# Patient Record
Sex: Female | Born: 1970 | Race: White | Hispanic: No | Marital: Single | Smoking: Current every day smoker
Health system: Southern US, Community
[De-identification: ages and names within clinical notes are randomized; demographics above are authoritative.]

## PROBLEM LIST (undated history)

## (undated) DIAGNOSIS — M1990 Unspecified osteoarthritis, unspecified site: Secondary | ICD-10-CM

## (undated) DIAGNOSIS — S62307A Unspecified fracture of fifth metacarpal bone, left hand, initial encounter for closed fracture: Secondary | ICD-10-CM

## (undated) DIAGNOSIS — S42362A Displaced segmental fracture of shaft of humerus, left arm, initial encounter for closed fracture: Secondary | ICD-10-CM

## (undated) DIAGNOSIS — Z8619 Personal history of other infectious and parasitic diseases: Secondary | ICD-10-CM

## (undated) DIAGNOSIS — R51 Headache: Secondary | ICD-10-CM

## (undated) DIAGNOSIS — F0390 Unspecified dementia without behavioral disturbance: Secondary | ICD-10-CM

## (undated) DIAGNOSIS — F0280 Dementia in other diseases classified elsewhere without behavioral disturbance: Secondary | ICD-10-CM

## (undated) DIAGNOSIS — G039 Meningitis, unspecified: Secondary | ICD-10-CM

## (undated) DIAGNOSIS — F419 Anxiety disorder, unspecified: Secondary | ICD-10-CM

## (undated) DIAGNOSIS — I959 Hypotension, unspecified: Secondary | ICD-10-CM

## (undated) DIAGNOSIS — R519 Headache, unspecified: Secondary | ICD-10-CM

## (undated) DIAGNOSIS — R112 Nausea with vomiting, unspecified: Secondary | ICD-10-CM

## (undated) DIAGNOSIS — I209 Angina pectoris, unspecified: Secondary | ICD-10-CM

## (undated) DIAGNOSIS — G8929 Other chronic pain: Secondary | ICD-10-CM

## (undated) DIAGNOSIS — G629 Polyneuropathy, unspecified: Secondary | ICD-10-CM

## (undated) DIAGNOSIS — J449 Chronic obstructive pulmonary disease, unspecified: Secondary | ICD-10-CM

## (undated) DIAGNOSIS — S52121A Displaced fracture of head of right radius, initial encounter for closed fracture: Secondary | ICD-10-CM

## (undated) DIAGNOSIS — A6920 Lyme disease, unspecified: Secondary | ICD-10-CM

## (undated) DIAGNOSIS — I82409 Acute embolism and thrombosis of unspecified deep veins of unspecified lower extremity: Secondary | ICD-10-CM

## (undated) DIAGNOSIS — R053 Chronic cough: Secondary | ICD-10-CM

## (undated) DIAGNOSIS — R42 Dizziness and giddiness: Secondary | ICD-10-CM

## (undated) DIAGNOSIS — G40909 Epilepsy, unspecified, not intractable, without status epilepticus: Secondary | ICD-10-CM

## (undated) DIAGNOSIS — M797 Fibromyalgia: Secondary | ICD-10-CM

## (undated) DIAGNOSIS — R05 Cough: Secondary | ICD-10-CM

## (undated) DIAGNOSIS — K219 Gastro-esophageal reflux disease without esophagitis: Secondary | ICD-10-CM

## (undated) DIAGNOSIS — F17200 Nicotine dependence, unspecified, uncomplicated: Secondary | ICD-10-CM

## (undated) DIAGNOSIS — Z9889 Other specified postprocedural states: Secondary | ICD-10-CM

## (undated) DIAGNOSIS — M549 Dorsalgia, unspecified: Secondary | ICD-10-CM

## (undated) HISTORY — PX: PARTIAL HYSTERECTOMY: SHX80

## (undated) HISTORY — PX: KNEE SURGERY: SHX244

## (undated) HISTORY — DX: Meningitis, unspecified: G03.9

## (undated) HISTORY — PX: BREAST SURGERY: SHX581

## (undated) HISTORY — DX: Acute embolism and thrombosis of unspecified deep veins of unspecified lower extremity: I82.409

## (undated) HISTORY — PX: OTHER SURGICAL HISTORY: SHX169

## (undated) HISTORY — DX: Headache, unspecified: R51.9

## (undated) HISTORY — DX: Anxiety disorder, unspecified: F41.9

## (undated) HISTORY — DX: Other chronic pain: G89.29

## (undated) HISTORY — PX: COLONOSCOPY W/ POLYPECTOMY: SHX1380

## (undated) HISTORY — PX: TOTAL KNEE ARTHROPLASTY: SHX125

## (undated) HISTORY — DX: Lyme disease, unspecified: A69.20

## (undated) HISTORY — DX: Unspecified osteoarthritis, unspecified site: M19.90

## (undated) HISTORY — DX: Headache: R51

## (undated) HISTORY — DX: Unspecified dementia, unspecified severity, without behavioral disturbance, psychotic disturbance, mood disturbance, and anxiety: F03.90

## (undated) HISTORY — PX: EYE SURGERY: SHX253

---

## 2002-10-02 HISTORY — PX: TUBAL LIGATION: SHX77

## 2013-02-21 ENCOUNTER — Institutional Professional Consult (permissible substitution): Payer: Self-pay | Admitting: Internal Medicine

## 2013-03-13 ENCOUNTER — Institutional Professional Consult (permissible substitution): Payer: Self-pay | Admitting: Internal Medicine

## 2013-03-14 ENCOUNTER — Ambulatory Visit (INDEPENDENT_AMBULATORY_CARE_PROVIDER_SITE_OTHER): Payer: Medicare Other | Admitting: Internal Medicine

## 2013-03-14 ENCOUNTER — Encounter: Payer: Self-pay | Admitting: Internal Medicine

## 2013-03-14 VITALS — BP 110/80 | HR 83 | Temp 97.4°F | Ht 67.25 in | Wt 206.8 lb

## 2013-03-14 DIAGNOSIS — R05 Cough: Secondary | ICD-10-CM

## 2013-03-14 MED ORDER — PREDNISONE (PAK) 10 MG PO TABS: ORAL_TABLET | ORAL | Status: AC

## 2013-03-14 NOTE — Patient Instructions (Addendum)
The key to effective treatment for your cough is eliminating the non-stop cycle of cough you're stuck in long enough to let your airway heal completely and then see if there is anything still making you cough once you stop the cough suppression, but this should take no more than 5 days to figure out  First take delsym two tsp every 12 hours and supplement if needed with  tramadol 50 mg up to 2 every 4 hours to suppress the urge to cough at all or even clear your throat. Swallowing water or using ice chips/non mint and menthol containing candies (such as lifesavers or sugarless jolly ranchers) are also effective.  You should rest your voice and avoid activities that you know make you cough.  Once you have eliminated the cough for 3 straight days try reducing the tramadol first,  then the delsym as tolerated.    Try nexium  Take 30- 60 min before your first and last meals of the day  Pepcid 40 mg one bedtime until cough is completely gone for at least a week without the need for cough suppression and take 2 Metopromide at bedtime plus chlortrimeton 4 mg at bedtime  I think of reflux for chronic cough like I do oxygen for fire (doesn't cause the fire but once you get the oxygen suppressed it usually goes away regardless of the exact cause).  GERD (REFLUX)  is an extremely common cause of respiratory symptoms, many times with no significant heartburn at all.    It can be treated with medication, but also with lifestyle changes including avoidance of late meals, excessive alcohol, smoking cessation, and avoid fatty foods, chocolate, peppermint, colas, red wine, and acidic juices such as orange juice.  NO MINT OR MENTHOL PRODUCTS SO NO COUGH DROPS  USE SUGARLESS CANDY INSTEAD (jolley ranchers or Stover's)  NO OIL BASED VITAMINS - use powdered substitutes.    Prednisone 10 mg take  4 each am x 2 days,   2 each am x 2 days,  1 each am x 2 days and stop    If not better > See Tammy NP w/in 2 weeks with  all your medications, even over the counter meds, separated in two separate bags, the ones you take no matter what vs the ones you stop once you feel better and take only as needed when you feel you need them.   Tammy  will generate for you a new user friendly medication calendar that will put [place] all on the same page re: your medication use.     Without this process, it simply isn't possible to assure that we are providing  your outpatient care  with  the attention to detail we feel you deserve.   If we cannot assure that you're getting that kind of care,  then we cannot manage your problem effectively from this clinic.  Once you have seen Tammy and we are sure that we're all on the same page with your medication use she will arrange follow up with me.

## 2013-03-14 NOTE — Progress Notes (Signed)
  Subjective:    Patient ID: Lori Dickerson, female    DOB: [birth year]   MRN: [id number]  HPI  48 yowf smoker with h/o recurrent "pna" x 2004 eval and Rx in [place] and requiring freq saba referred by Kendra Opitz at [facility] for refractory cough x 11/2012  03/14/2013 1st pulmonary ov cc nightly cough at hs years and poor sleep and worse in am but dry with chest and bilateral rib pain much worse x 3 months   has sense of throat soreness and too much throat mucus chronically assoc with mild dysyphia.  No obvious daytime variabilty or mucus production or cp or chest tightness, subjective wheeze overt sinus or hb symptoms. No unusual exp hx or h/o childhood pna/ asthma or knowledge of premature birth.    a. Also denies any obvious fluctuation of symptoms with weather or environmental changes or other aggravating or alleviating factors except as outlined above   Review of Systems  Constitutional: Negative for fever, chills and unexpected weight change.  HENT: Positive for sore throat, trouble swallowing and dental problem. Negative for ear pain, nosebleeds, congestion, rhinorrhea, sneezing, voice change, postnasal drip and sinus pressure.   Eyes: Negative for visual disturbance.  Respiratory: Positive for cough and shortness of breath. Negative for choking.   Cardiovascular: Positive for chest pain. Negative for leg swelling.  Gastrointestinal: Negative for vomiting, abdominal pain and diarrhea.  Genitourinary: Negative for difficulty urinating.  Musculoskeletal: Positive for arthralgias.  Skin: Negative for rash.  Neurological: Positive for headaches. Negative for tremors and syncope.  Hematological: Does not bruise/bleed easily.       Objective:   Physical Exam  Wt Readings from Last 3 Encounters:  03/14/13 206 lb 12.8 oz (93.804 kg)    HEENT: nl dentition, turbinates, and orophanx. Nl external ear canals without cough reflex   NECK :  without JVD/Nodes/TM/ nl carotid upstrokes  bilaterally   LUNGS: no acc muscle use, clear to A and P bilaterally without cough on insp or exp maneuvers   CV:  RRR  no s3 or murmur or increase in P2, no edema   ABD:  soft and nontender with nl excursion in the supine position. No bruits or organomegaly, bowel sounds nl  MS:  warm without deformities, calf tenderness, cyanosis or clubbing  SKIN: warm and dry without lesions    NEURO:  alert, approp, no deficits            Assessment & Plan:

## 2013-03-16 NOTE — Assessment & Plan Note (Addendum)
The most common causes of chronic cough in immunocompetent adults include the following: upper airway cough syndrome (UACS), previously referred to as postnasal drip syndrome (PNDS), which is caused by variety of rhinosinus conditions; (2) asthma; (3) GERD; (4) chronic bronchitis from cigarette smoking or other inhaled environmental irritants; (5) nonasthmatic eosinophilic bronchitis; and (6) bronchiectasis.   These conditions, singly or in combination, have accounted for up to 94% of the causes of chronic cough in prospective studies.   Other conditions have constituted no >6% of the causes in prospective studies These have included bronchogenic carcinoma, chronic interstitial pneumonia, sarcoidosis, left ventricular failure, ACEI-induced cough, and aspiration from a condition associated with pharyngeal dysfunction.    Unusual patter of cough in a smoker with no excess mucus suggests  Classic Upper airway cough syndrome, so named because it's frequently impossible to sort out how much is  CR/sinusitis with freq throat clearing (which can be related to primary GERD)   vs  causing  secondary (" extra esophageal")  GERD from wide swings in gastric pressure that occur with throat clearing, often  promoting self use of mint and menthol lozenges that reduce the lower esophageal sphincter tone and exacerbate the problem further in a cyclical fashion.   These are the same pts (now being labeled as having "irritable larynx syndrome" by some cough centers) who not infrequently have a history of having failed to tolerate ace inhibitors,  dry powder inhalers or biphosphonates or report having atypical reflux symptoms that don't respond to standard doses of PPI , and are easily confused as having aecopd or asthma flares by even experienced allergists/ pulmonologists.     For now max rx for gerd/cycylical cough then regroup in 2 weeks with all meds in hand.  NB The standardized cough guidelines published in Chest  by [place] in 2006 are still the best available and consist of a multiple step process (up to 12!) , not a single office visit,  and are intended  to address this problem logically,  with an alogrithm dependent on response to empiric treatment at  each progressive step  to determine a specific diagnosis with  minimal addtional testing needed. Therefore if adherence is an issue or can't be accurately verified,  it's very unlikely the standard evaluation and treatment will be successful here.    Furthermore, response to therapy (other than acute cough suppression, which should only be used short term with avoidance of narcotic containing cough syrups if possible), can be a gradual process for which the patient may perceive immediate benefit.  Unlike going to an eye doctor where the best perscription is almost always the first one and is immediately effective, this is almost never the case in the management of chronic cough syndromes. Therefore the patient needs to commit up front to consistently adhere to recommendations  for up to 6 weeks of therapy directed at the likely underlying problem(s) before the response can be reasonably evaluated.

## 2013-03-28 ENCOUNTER — Encounter: Payer: Medicare Other | Admitting: Adult Health

## 2015-07-18 ENCOUNTER — Emergency Department (HOSPITAL_COMMUNITY): Payer: Medicare Other

## 2015-07-18 ENCOUNTER — Inpatient Hospital Stay (HOSPITAL_COMMUNITY)
Admission: EM | Admit: 2015-07-18 | Discharge: 2015-07-31 | DRG: 958 | Disposition: A | Discharge status: Skilled Nursing Facility | Payer: Medicare Other | Attending: General Surgery | Admitting: General Surgery

## 2015-07-18 ENCOUNTER — Encounter (HOSPITAL_COMMUNITY): Payer: Self-pay | Admitting: Emergency Medicine

## 2015-07-18 DIAGNOSIS — F17210 Nicotine dependence, cigarettes, uncomplicated: Secondary | ICD-10-CM | POA: Diagnosis not present

## 2015-07-18 DIAGNOSIS — G5630 Lesion of radial nerve, unspecified upper limb: Secondary | ICD-10-CM | POA: Diagnosis not present

## 2015-07-18 DIAGNOSIS — S52121A Displaced fracture of head of right radius, initial encounter for closed fracture: Secondary | ICD-10-CM | POA: Diagnosis not present

## 2015-07-18 DIAGNOSIS — M21929 Unspecified acquired deformity of unspecified upper arm: Secondary | ICD-10-CM

## 2015-07-18 DIAGNOSIS — S069X9A Unspecified intracranial injury with loss of consciousness of unspecified duration, initial encounter: Secondary | ICD-10-CM | POA: Diagnosis present

## 2015-07-18 DIAGNOSIS — S62317A Displaced fracture of base of fifth metacarpal bone. left hand, initial encounter for closed fracture: Secondary | ICD-10-CM | POA: Diagnosis present

## 2015-07-18 DIAGNOSIS — Z23 Encounter for immunization: Secondary | ICD-10-CM

## 2015-07-18 DIAGNOSIS — I959 Hypotension, unspecified: Secondary | ICD-10-CM | POA: Diagnosis not present

## 2015-07-18 DIAGNOSIS — R52 Pain, unspecified: Secondary | ICD-10-CM

## 2015-07-18 DIAGNOSIS — G839 Paralytic syndrome, unspecified: Secondary | ICD-10-CM | POA: Diagnosis present

## 2015-07-18 DIAGNOSIS — D62 Acute posthemorrhagic anemia: Secondary | ICD-10-CM | POA: Diagnosis not present

## 2015-07-18 DIAGNOSIS — J939 Pneumothorax, unspecified: Secondary | ICD-10-CM | POA: Diagnosis not present

## 2015-07-18 DIAGNOSIS — M797 Fibromyalgia: Secondary | ICD-10-CM | POA: Diagnosis not present

## 2015-07-18 DIAGNOSIS — Z6834 Body mass index (BMI) 34.0-34.9, adult: Secondary | ICD-10-CM | POA: Diagnosis not present

## 2015-07-18 DIAGNOSIS — S2242XA Multiple fractures of ribs, left side, initial encounter for closed fracture: Secondary | ICD-10-CM | POA: Diagnosis present

## 2015-07-18 DIAGNOSIS — K219 Gastro-esophageal reflux disease without esophagitis: Secondary | ICD-10-CM | POA: Diagnosis not present

## 2015-07-18 DIAGNOSIS — F419 Anxiety disorder, unspecified: Secondary | ICD-10-CM | POA: Diagnosis not present

## 2015-07-18 DIAGNOSIS — E559 Vitamin D deficiency, unspecified: Secondary | ICD-10-CM | POA: Diagnosis not present

## 2015-07-18 DIAGNOSIS — G8929 Other chronic pain: Secondary | ICD-10-CM | POA: Diagnosis not present

## 2015-07-18 DIAGNOSIS — K5900 Constipation, unspecified: Secondary | ICD-10-CM

## 2015-07-18 DIAGNOSIS — F17200 Nicotine dependence, unspecified, uncomplicated: Secondary | ICD-10-CM | POA: Diagnosis present

## 2015-07-18 DIAGNOSIS — E876 Hypokalemia: Secondary | ICD-10-CM | POA: Diagnosis not present

## 2015-07-18 DIAGNOSIS — S5420XA Injury of radial nerve at forearm level, unspecified arm, initial encounter: Secondary | ICD-10-CM | POA: Diagnosis not present

## 2015-07-18 DIAGNOSIS — J449 Chronic obstructive pulmonary disease, unspecified: Secondary | ICD-10-CM | POA: Diagnosis not present

## 2015-07-18 DIAGNOSIS — S42212A Unspecified displaced fracture of surgical neck of left humerus, initial encounter for closed fracture: Secondary | ICD-10-CM | POA: Diagnosis present

## 2015-07-18 DIAGNOSIS — S62307A Unspecified fracture of fifth metacarpal bone, left hand, initial encounter for closed fracture: Secondary | ICD-10-CM | POA: Diagnosis not present

## 2015-07-18 DIAGNOSIS — S42302A Unspecified fracture of shaft of humerus, left arm, initial encounter for closed fracture: Secondary | ICD-10-CM | POA: Diagnosis not present

## 2015-07-18 DIAGNOSIS — H538 Other visual disturbances: Secondary | ICD-10-CM | POA: Diagnosis not present

## 2015-07-18 DIAGNOSIS — Z79891 Long term (current) use of opiate analgesic: Secondary | ICD-10-CM | POA: Diagnosis not present

## 2015-07-18 DIAGNOSIS — K567 Ileus, unspecified: Secondary | ICD-10-CM | POA: Diagnosis not present

## 2015-07-18 DIAGNOSIS — S270XXA Traumatic pneumothorax, initial encounter: Secondary | ICD-10-CM | POA: Diagnosis present

## 2015-07-18 DIAGNOSIS — M542 Cervicalgia: Secondary | ICD-10-CM

## 2015-07-18 DIAGNOSIS — G40909 Epilepsy, unspecified, not intractable, without status epilepticus: Secondary | ICD-10-CM | POA: Diagnosis not present

## 2015-07-18 DIAGNOSIS — F329 Major depressive disorder, single episode, unspecified: Secondary | ICD-10-CM | POA: Diagnosis present

## 2015-07-18 DIAGNOSIS — M25373 Other instability, unspecified ankle: Secondary | ICD-10-CM | POA: Diagnosis not present

## 2015-07-18 DIAGNOSIS — G43909 Migraine, unspecified, not intractable, without status migrainosus: Secondary | ICD-10-CM | POA: Diagnosis not present

## 2015-07-18 DIAGNOSIS — Z419 Encounter for procedure for purposes other than remedying health state, unspecified: Secondary | ICD-10-CM

## 2015-07-18 DIAGNOSIS — M549 Dorsalgia, unspecified: Secondary | ICD-10-CM | POA: Diagnosis present

## 2015-07-18 DIAGNOSIS — S62501A Fracture of unspecified phalanx of right thumb, initial encounter for closed fracture: Secondary | ICD-10-CM | POA: Diagnosis present

## 2015-07-18 DIAGNOSIS — S42352A Displaced comminuted fracture of shaft of humerus, left arm, initial encounter for closed fracture: Secondary | ICD-10-CM | POA: Diagnosis present

## 2015-07-18 DIAGNOSIS — G5632 Lesion of radial nerve, left upper limb: Secondary | ICD-10-CM | POA: Diagnosis present

## 2015-07-18 DIAGNOSIS — T148XXA Other injury of unspecified body region, initial encounter: Secondary | ICD-10-CM

## 2015-07-18 DIAGNOSIS — M79602 Pain in left arm: Secondary | ICD-10-CM | POA: Diagnosis present

## 2015-07-18 DIAGNOSIS — S42362A Displaced segmental fracture of shaft of humerus, left arm, initial encounter for closed fracture: Secondary | ICD-10-CM

## 2015-07-18 HISTORY — DX: Personal history of other infectious and parasitic diseases: Z86.19

## 2015-07-18 HISTORY — DX: Displaced fracture of head of right radius, initial encounter for closed fracture: S52.121A

## 2015-07-18 HISTORY — DX: Other chronic pain: G89.29

## 2015-07-18 HISTORY — DX: Polyneuropathy, unspecified: G62.9

## 2015-07-18 HISTORY — DX: Fibromyalgia: M79.7

## 2015-07-18 HISTORY — DX: Hypotension, unspecified: I95.9

## 2015-07-18 HISTORY — DX: Unspecified fracture of fifth metacarpal bone, left hand, initial encounter for closed fracture: S62.307A

## 2015-07-18 HISTORY — DX: Dorsalgia, unspecified: M54.9

## 2015-07-18 HISTORY — DX: Nicotine dependence, unspecified, uncomplicated: F17.200

## 2015-07-18 HISTORY — DX: Dizziness and giddiness: R42

## 2015-07-18 HISTORY — DX: Cough: R05

## 2015-07-18 HISTORY — DX: Chronic obstructive pulmonary disease, unspecified: J44.9

## 2015-07-18 HISTORY — DX: Epilepsy, unspecified, not intractable, without status epilepticus: G40.909

## 2015-07-18 HISTORY — DX: Chronic cough: R05.3

## 2015-07-18 HISTORY — DX: Dementia in other diseases classified elsewhere, unspecified severity, without behavioral disturbance, psychotic disturbance, mood disturbance, and anxiety: F02.80

## 2015-07-18 HISTORY — DX: Displaced segmental fracture of shaft of humerus, left arm, initial encounter for closed fracture: S42.362A

## 2015-07-18 LAB — COMPREHENSIVE METABOLIC PANEL
ALBUMIN: 3.5 g/dL (ref 3.5–5.0)
ALT: 10 U/L — ABNORMAL LOW (ref 14–54)
AST: 26 U/L (ref 15–41)
Alkaline Phosphatase: 73 U/L (ref 38–126)
Anion gap: 10 (ref 5–15)
BILIRUBIN TOTAL: 0.3 mg/dL (ref 0.3–1.2)
BUN: 9 mg/dL (ref 6–20)
CHLORIDE: 109 mmol/L (ref 101–111)
CO2: 19 mmol/L — ABNORMAL LOW (ref 22–32)
Calcium: 9 mg/dL (ref 8.9–10.3)
Creatinine, Ser: 0.77 mg/dL (ref 0.44–1.00)
GFR calc Af Amer: >60 mL/min (ref >60)
GFR calc non Af Amer: >60 mL/min (ref >60)
Glucose, Bld: 140 mg/dL — ABNORMAL HIGH (ref 65–99)
POTASSIUM: 4.1 mmol/L (ref 3.5–5.1)
Sodium: 138 mmol/L (ref 135–145)
Total Protein: 6.3 g/dL — ABNORMAL LOW (ref 6.5–8.1)

## 2015-07-18 LAB — CBC
HEMATOCRIT: 38.1 % (ref 36.0–46.0)
Hemoglobin: 13.1 g/dL (ref 12.0–15.0)
MCH: 32.6 pg (ref 26.0–34.0)
MCHC: 34.4 g/dL (ref 30.0–36.0)
MCV: 94.8 fL (ref 78.0–100.0)
Platelets: 272 10*3/uL (ref 150–400)
RBC: 4.02 MIL/uL (ref 3.87–5.11)
RDW: 13.7 % (ref 11.5–15.5)
WBC: 16.8 10*3/uL — ABNORMAL HIGH (ref 4.0–10.5)

## 2015-07-18 LAB — I-STAT CHEM 8, ED
BUN: 9 mg/dL (ref 6–20)
CHLORIDE: 107 mmol/L (ref 101–111)
Calcium, Ion: 1.17 mmol/L (ref 1.12–1.23)
Creatinine, Ser: 0.7 mg/dL (ref 0.44–1.00)
Glucose, Bld: 136 mg/dL — ABNORMAL HIGH (ref 65–99)
HEMATOCRIT: 42 % (ref 36.0–46.0)
Hemoglobin: 14.3 g/dL (ref 12.0–15.0)
Potassium: 4.1 mmol/L (ref 3.5–5.1)
SODIUM: 139 mmol/L (ref 135–145)
TCO2: 20 mmol/L (ref 0–100)

## 2015-07-18 LAB — PROTIME-INR
INR: 1.03 (ref 0.00–1.49)
Prothrombin Time: 13.7 seconds (ref 11.6–15.2)

## 2015-07-18 LAB — SAMPLE TO BLOOD BANK

## 2015-07-18 LAB — ETHANOL: Alcohol, Ethyl (B): <5 mg/dL (ref <5)

## 2015-07-18 LAB — CDS SEROLOGY

## 2015-07-18 MED ORDER — HYDROMORPHONE HCL 1 MG/ML IJ SOLN
1.0000 mg | Freq: Once | INTRAMUSCULAR | Status: AC
  Administered 2015-07-18: 1 mg via INTRAVENOUS
  Filled 2015-07-18: qty 1

## 2015-07-18 MED ORDER — ONDANSETRON HCL 4 MG/2ML IJ SOLN: 4.0000 mg | Freq: Four times a day (QID) | INTRAMUSCULAR | Status: DC | PRN

## 2015-07-18 MED ORDER — MORPHINE SULFATE (PF) 2 MG/ML IV SOLN
2.0000 mg | INTRAVENOUS | Status: DC | PRN
  Administered 2015-07-18 (×2): 2 mg via INTRAVENOUS
  Filled 2015-07-18 (×2): qty 1

## 2015-07-18 MED ORDER — MORPHINE SULFATE 1 MG/ML IV SOLN: INTRAVENOUS | Status: DC

## 2015-07-18 MED ORDER — FENTANYL CITRATE (PF) 100 MCG/2ML IJ SOLN
INTRAMUSCULAR | Status: AC
  Filled 2015-07-18: qty 2

## 2015-07-18 MED ORDER — KETAMINE HCL 10 MG/ML IJ SOLN
0.3000 mg/kg | Freq: Once | INTRAMUSCULAR | Status: AC
  Administered 2015-07-18: 30 mg via INTRAVENOUS
  Filled 2015-07-18: qty 3

## 2015-07-18 MED ORDER — ALPRAZOLAM 0.5 MG PO TABS
2.0000 mg | ORAL_TABLET | Freq: Two times a day (BID) | ORAL | Status: DC | PRN
  Administered 2015-07-19 – 2015-07-31 (×21): 2 mg via ORAL
  Filled 2015-07-18 (×21): qty 4

## 2015-07-18 MED ORDER — SODIUM CHLORIDE 0.9 % IV SOLN
INTRAVENOUS | Status: DC
  Administered 2015-07-18 – 2015-07-20 (×2): via INTRAVENOUS

## 2015-07-18 MED ORDER — SODIUM CHLORIDE 0.9 % IV SOLN
INTRAVENOUS | Status: AC | PRN
  Administered 2015-07-18 (×3): 1000 mL via INTRAVENOUS

## 2015-07-18 MED ORDER — DIPHENHYDRAMINE HCL 50 MG/ML IJ SOLN
12.5000 mg | Freq: Four times a day (QID) | INTRAMUSCULAR | Status: DC | PRN
  Administered 2015-07-19: 12.5 mg via INTRAVENOUS
  Filled 2015-07-18: qty 1

## 2015-07-18 MED ORDER — FENTANYL CITRATE (PF) 100 MCG/2ML IJ SOLN
25.0000 ug | Freq: Once | INTRAMUSCULAR | Status: AC
  Administered 2015-07-18: 25 ug via INTRAVENOUS

## 2015-07-18 MED ORDER — ONDANSETRON HCL 4 MG/2ML IJ SOLN
4.0000 mg | Freq: Four times a day (QID) | INTRAMUSCULAR | Status: DC | PRN
  Administered 2015-07-23 (×2): 4 mg via INTRAVENOUS
  Filled 2015-07-18 (×2): qty 2

## 2015-07-18 MED ORDER — SODIUM CHLORIDE 0.9 % IJ SOLN: 9.0000 mL | INTRAMUSCULAR | Status: DC | PRN

## 2015-07-18 MED ORDER — ONDANSETRON HCL 4 MG PO TABS
4.0000 mg | ORAL_TABLET | Freq: Four times a day (QID) | ORAL | Status: DC | PRN
  Filled 2015-07-18: qty 1

## 2015-07-18 MED ORDER — OXYCODONE HCL 5 MG PO TABS: 5.0000 mg | ORAL_TABLET | ORAL | Status: DC | PRN

## 2015-07-18 MED ORDER — IOHEXOL 300 MG/ML  SOLN
100.0000 mL | Freq: Once | INTRAMUSCULAR | Status: AC | PRN
  Administered 2015-07-18: 100 mL via INTRAVENOUS

## 2015-07-18 MED ORDER — KETAMINE HCL 10 MG/ML IJ SOLN
0.3000 mg/kg | Freq: Once | INTRAMUSCULAR | Status: AC
  Administered 2015-07-18: 30 mg via INTRAVENOUS

## 2015-07-18 MED ORDER — NALOXONE HCL 0.4 MG/ML IJ SOLN: 0.4000 mg | INTRAMUSCULAR | Status: DC | PRN

## 2015-07-18 MED ORDER — DIPHENHYDRAMINE HCL 12.5 MG/5ML PO ELIX
12.5000 mg | ORAL_SOLUTION | Freq: Four times a day (QID) | ORAL | Status: DC | PRN
  Filled 2015-07-18: qty 5

## 2015-07-18 NOTE — ED Notes (Signed)
Joyice FasterGabby, daughter back at the bedside with patient. Pt very anxious and tearful. Daughter talking with her about the death of her mother.
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
## 2015-07-18 NOTE — Progress Notes (Signed)
Orthopedic Tech Progress Note Patient Details:  Lori Dickerson 12/09/1970 [id number]  Ortho Devices Type of Ortho Device: Ace wrap, Coapt, Arm sling Ortho Device/Splint Location: LUE Ortho Device/Splint Interventions: Ordered, Application   [name], Ambrose Wile Craig 07/18/2015, 8:23 PM
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
## 2015-07-18 NOTE — ED Notes (Signed)
pulses present in left wrist.
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
## 2015-07-18 NOTE — ED Notes (Signed)
Pt transported to CT, xray an back by this RN pt received 1L NS during CT due to hypotension. Pt tolerated remaining scans well.
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
## 2015-07-19 ENCOUNTER — Encounter (HOSPITAL_COMMUNITY): Admission: EM | Disposition: A | Discharge status: Skilled Nursing Facility | Payer: Self-pay | Source: Home / Self Care

## 2015-07-19 ENCOUNTER — Encounter (HOSPITAL_COMMUNITY): Payer: Self-pay | Admitting: *Deleted

## 2015-07-19 ENCOUNTER — Inpatient Hospital Stay (HOSPITAL_COMMUNITY): Payer: Medicare Other

## 2015-07-19 ENCOUNTER — Inpatient Hospital Stay (HOSPITAL_COMMUNITY): Payer: Medicare Other | Admitting: Anesthesiology

## 2015-07-19 HISTORY — PX: ORIF HUMERUS FRACTURE: SHX2126

## 2015-07-19 LAB — BASIC METABOLIC PANEL
ANION GAP: 7 (ref 5–15)
BUN: 6 mg/dL (ref 6–20)
CO2: 20 mmol/L — ABNORMAL LOW (ref 22–32)
Calcium: 8.2 mg/dL — ABNORMAL LOW (ref 8.9–10.3)
Chloride: 111 mmol/L (ref 101–111)
Creatinine, Ser: 0.52 mg/dL (ref 0.44–1.00)
GFR calc Af Amer: >60 mL/min (ref >60)
Glucose, Bld: 100 mg/dL — ABNORMAL HIGH (ref 65–99)
POTASSIUM: 3.9 mmol/L (ref 3.5–5.1)
SODIUM: 138 mmol/L (ref 135–145)

## 2015-07-19 LAB — RAPID URINE DRUG SCREEN, HOSP PERFORMED
AMPHETAMINES: POSITIVE — AB
BARBITURATES: POSITIVE — AB
Benzodiazepines: POSITIVE — AB
Cocaine: NOT DETECTED
Opiates: POSITIVE — AB
Tetrahydrocannabinol: NOT DETECTED

## 2015-07-19 LAB — TYPE AND SCREEN
ABO/RH(D): A NEG
ANTIBODY SCREEN: NEGATIVE

## 2015-07-19 LAB — MRSA PCR SCREENING: MRSA BY PCR: NEGATIVE

## 2015-07-19 LAB — PROTIME-INR
INR: 1.09 (ref 0.00–1.49)
Prothrombin Time: 14.3 seconds (ref 11.6–15.2)

## 2015-07-19 LAB — CBC
HCT: 31.8 % — ABNORMAL LOW (ref 36.0–46.0)
Hemoglobin: 11.1 g/dL — ABNORMAL LOW (ref 12.0–15.0)
MCH: 32.6 pg (ref 26.0–34.0)
MCHC: 34.9 g/dL (ref 30.0–36.0)
MCV: 93.3 fL (ref 78.0–100.0)
PLATELETS: 213 10*3/uL (ref 150–400)
RBC: 3.41 MIL/uL — AB (ref 3.87–5.11)
RDW: 13.8 % (ref 11.5–15.5)
WBC: 9 10*3/uL (ref 4.0–10.5)

## 2015-07-19 LAB — ABO/RH: ABO/RH(D): A NEG

## 2015-07-19 SURGERY — OPEN REDUCTION INTERNAL FIXATION (ORIF) PROXIMAL HUMERUS FRACTURE
Anesthesia: General | Site: Arm Upper | Laterality: Left

## 2015-07-19 MED ORDER — LACTATED RINGERS IV SOLN
INTRAVENOUS | Status: DC
  Administered 2015-07-19: 13:00:00 via INTRAVENOUS

## 2015-07-19 MED ORDER — MORPHINE SULFATE 1 MG/ML IV SOLN
INTRAVENOUS | Status: DC
  Administered 2015-07-19 (×3): via INTRAVENOUS
  Filled 2015-07-19 (×3): qty 25

## 2015-07-19 MED ORDER — HYDROMORPHONE 0.3 MG/ML IV SOLN
INTRAVENOUS | Status: DC
  Filled 2015-07-19 (×2): qty 25

## 2015-07-19 MED ORDER — HYDROMORPHONE 1 MG/ML IV SOLN
INTRAVENOUS | Status: DC
  Administered 2015-07-19 (×2): via INTRAVENOUS
  Administered 2015-07-19: 2.8 mg via INTRAVENOUS
  Administered 2015-07-20: 1.8 mg via INTRAVENOUS
  Administered 2015-07-20: 4.2 mg via INTRAVENOUS
  Administered 2015-07-20: 03:00:00 via INTRAVENOUS
  Filled 2015-07-19: qty 25

## 2015-07-19 MED ORDER — ONDANSETRON HCL 4 MG/2ML IJ SOLN: 4.0000 mg | Freq: Four times a day (QID) | INTRAMUSCULAR | Status: DC | PRN

## 2015-07-19 MED ORDER — OXYCODONE HCL 5 MG PO TABS
5.0000 mg | ORAL_TABLET | ORAL | Status: DC | PRN
  Administered 2015-07-20: 15 mg via ORAL
  Filled 2015-07-19: qty 3

## 2015-07-19 MED ORDER — OXYCODONE-ACETAMINOPHEN 5-325 MG PO TABS
1.0000 | ORAL_TABLET | Freq: Four times a day (QID) | ORAL | Status: DC | PRN
  Administered 2015-07-19 – 2015-07-20 (×2): 2 via ORAL
  Filled 2015-07-19 (×2): qty 2

## 2015-07-19 MED ORDER — OXYCODONE HCL 5 MG/5ML PO SOLN: 5.0000 mg | Freq: Once | ORAL | Status: DC | PRN

## 2015-07-19 MED ORDER — DEXAMETHASONE SODIUM PHOSPHATE 10 MG/ML IJ SOLN
INTRAMUSCULAR | Status: DC | PRN
  Administered 2015-07-19: 10 mg via INTRAVENOUS

## 2015-07-19 MED ORDER — PROPOFOL 10 MG/ML IV BOLUS
INTRAVENOUS | Status: DC | PRN
  Administered 2015-07-19: 130 mg via INTRAVENOUS

## 2015-07-19 MED ORDER — DEXTROSE 5 % IV SOLN
INTRAVENOUS | Status: DC | PRN
  Administered 2015-07-19: 14:00:00 via INTRAVENOUS

## 2015-07-19 MED ORDER — MIDAZOLAM HCL 5 MG/5ML IJ SOLN
INTRAMUSCULAR | Status: DC | PRN
  Administered 2015-07-19: 0.5 mg via INTRAVENOUS

## 2015-07-19 MED ORDER — DIPHENHYDRAMINE HCL 50 MG/ML IJ SOLN: 12.5000 mg | Freq: Four times a day (QID) | INTRAMUSCULAR | Status: DC | PRN

## 2015-07-19 MED ORDER — HYDROMORPHONE HCL 1 MG/ML IJ SOLN
0.2500 mg | INTRAMUSCULAR | Status: DC | PRN
  Administered 2015-07-19: 0.5 mg via INTRAVENOUS

## 2015-07-19 MED ORDER — LIDOCAINE HCL (CARDIAC) 20 MG/ML IV SOLN
INTRAVENOUS | Status: DC | PRN
  Administered 2015-07-19: 40 mg via INTRAVENOUS

## 2015-07-19 MED ORDER — PHENYLEPHRINE HCL 10 MG/ML IJ SOLN
INTRAMUSCULAR | Status: DC | PRN
  Administered 2015-07-19: 40 ug via INTRAVENOUS
  Administered 2015-07-19 (×3): 80 ug via INTRAVENOUS
  Administered 2015-07-19: 40 ug via INTRAVENOUS
  Administered 2015-07-19 (×4): 80 ug via INTRAVENOUS

## 2015-07-19 MED ORDER — FENTANYL CITRATE (PF) 100 MCG/2ML IJ SOLN
INTRAMUSCULAR | Status: DC | PRN
  Administered 2015-07-19: 100 ug via INTRAVENOUS
  Administered 2015-07-19: 25 ug via INTRAVENOUS

## 2015-07-19 MED ORDER — LACTATED RINGERS IV SOLN
INTRAVENOUS | Status: DC | PRN
  Administered 2015-07-19 (×3): via INTRAVENOUS

## 2015-07-19 MED ORDER — NEOSTIGMINE METHYLSULFATE 10 MG/10ML IV SOLN
INTRAVENOUS | Status: DC | PRN
  Administered 2015-07-19: 5 mg via INTRAVENOUS

## 2015-07-19 MED ORDER — ONDANSETRON HCL 4 MG/2ML IJ SOLN
INTRAMUSCULAR | Status: DC | PRN
  Administered 2015-07-19: 4 mg via INTRAVENOUS

## 2015-07-19 MED ORDER — CEFAZOLIN SODIUM-DEXTROSE 2-3 GM-% IV SOLR
2.0000 g | INTRAVENOUS | Status: AC
  Administered 2015-07-19: 2 g via INTRAVENOUS
  Filled 2015-07-19: qty 50

## 2015-07-19 MED ORDER — NALOXONE HCL 0.4 MG/ML IJ SOLN: 0.4000 mg | INTRAMUSCULAR | Status: DC | PRN

## 2015-07-19 MED ORDER — SUCCINYLCHOLINE CHLORIDE 20 MG/ML IJ SOLN
INTRAMUSCULAR | Status: DC | PRN
  Administered 2015-07-19: 100 mg via INTRAVENOUS

## 2015-07-19 MED ORDER — MIDAZOLAM HCL 2 MG/2ML IJ SOLN
INTRAMUSCULAR | Status: AC
  Filled 2015-07-19: qty 4

## 2015-07-19 MED ORDER — SODIUM CHLORIDE 0.9 % IJ SOLN: 9.0000 mL | INTRAMUSCULAR | Status: DC | PRN

## 2015-07-19 MED ORDER — LEVETIRACETAM 500 MG PO TABS
500.0000 mg | ORAL_TABLET | Freq: Two times a day (BID) | ORAL | Status: DC
  Administered 2015-07-19 – 2015-07-20 (×4): 500 mg via ORAL
  Filled 2015-07-19 (×4): qty 1

## 2015-07-19 MED ORDER — OXYCODONE HCL 5 MG PO TABS
5.0000 mg | ORAL_TABLET | Freq: Four times a day (QID) | ORAL | Status: DC | PRN
  Administered 2015-07-19 – 2015-07-20 (×2): 10 mg via ORAL
  Filled 2015-07-19 (×2): qty 2

## 2015-07-19 MED ORDER — GABAPENTIN 600 MG PO TABS
300.0000 mg | ORAL_TABLET | Freq: Three times a day (TID) | ORAL | Status: DC
  Administered 2015-07-19: 300 mg via ORAL
  Filled 2015-07-19: qty 0.5
  Filled 2015-07-19: qty 1
  Filled 2015-07-19: qty 0.5

## 2015-07-19 MED ORDER — CEFAZOLIN SODIUM-DEXTROSE 2-3 GM-% IV SOLR
2.0000 g | Freq: Three times a day (TID) | INTRAVENOUS | Status: AC
  Administered 2015-07-20 (×3): 2 g via INTRAVENOUS
  Filled 2015-07-19 (×4): qty 50

## 2015-07-19 MED ORDER — OXYCODONE HCL 5 MG PO TABS: 5.0000 mg | ORAL_TABLET | Freq: Once | ORAL | Status: DC | PRN

## 2015-07-19 MED ORDER — HYDROMORPHONE HCL 1 MG/ML IJ SOLN
INTRAMUSCULAR | Status: AC
  Administered 2015-07-20: 1 mg via INTRAVENOUS
  Filled 2015-07-19: qty 1

## 2015-07-19 MED ORDER — GLYCOPYRROLATE 0.2 MG/ML IJ SOLN
INTRAMUSCULAR | Status: DC | PRN
  Administered 2015-07-19: .8 mg via INTRAVENOUS

## 2015-07-19 MED ORDER — DIPHENHYDRAMINE HCL 12.5 MG/5ML PO ELIX
12.5000 mg | ORAL_SOLUTION | Freq: Four times a day (QID) | ORAL | Status: DC | PRN
  Filled 2015-07-19: qty 5

## 2015-07-19 MED ORDER — ROCURONIUM BROMIDE 100 MG/10ML IV SOLN
INTRAVENOUS | Status: DC | PRN
  Administered 2015-07-19: 30 mg via INTRAVENOUS
  Administered 2015-07-19 (×2): 10 mg via INTRAVENOUS

## 2015-07-19 MED ORDER — PANTOPRAZOLE SODIUM 40 MG IV SOLR
40.0000 mg | INTRAVENOUS | Status: DC
  Administered 2015-07-19 – 2015-07-20 (×2): 40 mg via INTRAVENOUS
  Filled 2015-07-19 (×2): qty 40

## 2015-07-19 SURGICAL SUPPLY — 85 items
BANDAGE ELASTIC 4 VELCRO ST LF (GAUZE/BANDAGES/DRESSINGS) ×3 IMPLANT
BANDAGE ELASTIC 6 VELCRO ST LF (GAUZE/BANDAGES/DRESSINGS) ×3 IMPLANT
BENZOIN TINCTURE PRP APPL 2/3 (GAUZE/BANDAGES/DRESSINGS) ×6 IMPLANT
BIT DRILL 2.5X2.75 QC CALB (BIT) ×3 IMPLANT
BIT DRILL 3.2 (BIT) ×2
BIT DRILL 3.2XCALB NS DISP (BIT) ×1 IMPLANT
BIT DRILL 3.5X5.5 QC CALB (BIT) ×3 IMPLANT
BIT DRILL CALIBRATED 2.7 (BIT) ×2 IMPLANT
BIT DRILL CALIBRATED 2.7MM (BIT) ×1
BIT DRL 3.2XCALB NS DISP (BIT) ×1
BNDG GAUZE ELAST 4 BULKY (GAUZE/BANDAGES/DRESSINGS) ×3 IMPLANT
BONE CANC CHIPS 20CC PCAN1/4 (Bone Implant) ×3 IMPLANT
BRUSH SCRUB DISP (MISCELLANEOUS) ×6 IMPLANT
CHIPS CANC BONE 20CC PCAN1/4 (Bone Implant) ×1 IMPLANT
COVER SURGICAL LIGHT HANDLE (MISCELLANEOUS) ×6 IMPLANT
DRAPE C-ARM 42X72 X-RAY (DRAPES) ×3 IMPLANT
DRAPE C-ARMOR (DRAPES) ×3 IMPLANT
DRAPE IMP U-DRAPE 54X76 (DRAPES) ×3 IMPLANT
DRAPE INCISE IOBAN 66X45 STRL (DRAPES) IMPLANT
DRAPE ORTHO SPLIT 77X108 STRL (DRAPES) ×4
DRAPE SURG 17X11 SM STRL (DRAPES) ×6 IMPLANT
DRAPE SURG ORHT 6 SPLT 77X108 (DRAPES) ×2 IMPLANT
DRAPE U-SHAPE 47X51 STRL (DRAPES) ×6 IMPLANT
DRSG ADAPTIC 3X8 NADH LF (GAUZE/BANDAGES/DRESSINGS) ×3 IMPLANT
DRSG MEPITEL 4X7.2 (GAUZE/BANDAGES/DRESSINGS) ×3 IMPLANT
DRSG PAD ABDOMINAL 8X10 ST (GAUZE/BANDAGES/DRESSINGS) ×3 IMPLANT
ELECT REM PT RETURN 9FT ADLT (ELECTROSURGICAL) ×3
ELECTRODE REM PT RTRN 9FT ADLT (ELECTROSURGICAL) ×1 IMPLANT
EVACUATOR 1/8 PVC DRAIN (DRAIN) IMPLANT
GAUZE SPONGE 4X4 12PLY STRL (GAUZE/BANDAGES/DRESSINGS) ×3 IMPLANT
GLOVE BIO SURGEON STRL SZ7.5 (GLOVE) ×3 IMPLANT
GLOVE BIO SURGEON STRL SZ8 (GLOVE) ×3 IMPLANT
GLOVE BIOGEL PI IND STRL 7.5 (GLOVE) ×1 IMPLANT
GLOVE BIOGEL PI IND STRL 8 (GLOVE) ×1 IMPLANT
GLOVE BIOGEL PI INDICATOR 7.5 (GLOVE) ×2
GLOVE BIOGEL PI INDICATOR 8 (GLOVE) ×2
GOWN STRL REUS W/ TWL LRG LVL3 (GOWN DISPOSABLE) ×2 IMPLANT
GOWN STRL REUS W/ TWL XL LVL3 (GOWN DISPOSABLE) ×1 IMPLANT
GOWN STRL REUS W/TWL 2XL LVL3 (GOWN DISPOSABLE) ×3 IMPLANT
GOWN STRL REUS W/TWL LRG LVL3 (GOWN DISPOSABLE) ×4
GOWN STRL REUS W/TWL XL LVL3 (GOWN DISPOSABLE) ×2
K-WIRE 2X5 SS THRDED S3 (WIRE) ×6
KIT BASIN OR (CUSTOM PROCEDURE TRAY) ×3 IMPLANT
KIT ROOM TURNOVER OR (KITS) ×3 IMPLANT
KWIRE 2X5 SS THRDED S3 (WIRE) ×2 IMPLANT
LOOP VESSEL MAXI BLUE (MISCELLANEOUS) IMPLANT
MANIFOLD NEPTUNE II (INSTRUMENTS) ×3 IMPLANT
NS IRRIG 1000ML POUR BTL (IV SOLUTION) ×3 IMPLANT
PACK TOTAL JOINT (CUSTOM PROCEDURE TRAY) ×3 IMPLANT
PACK UNIVERSAL I (CUSTOM PROCEDURE TRAY) ×3 IMPLANT
PAD ARMBOARD 7.5X6 YLW CONV (MISCELLANEOUS) ×6 IMPLANT
PAD CAST 4YDX4 CTTN HI CHSV (CAST SUPPLIES) ×1 IMPLANT
PADDING CAST COTTON 4X4 STRL (CAST SUPPLIES) ×2
PEG LOCKING 3.2MMX44 (Peg) ×3 IMPLANT
PEG LOCKING 3.2X34 (Screw) ×6 IMPLANT
PEG LOCKING 3.2X40 (Peg) ×3 IMPLANT
PEG LOCKING 3.2X42 (Screw) ×3 IMPLANT
PLATE PROX HUM LT 14H LOW (Plate) ×3 IMPLANT
SCREW CORT 3.5X16 815037016 (Screw) ×3 IMPLANT
SCREW CORTICAL 3.5MM 18MM (Screw) ×3 IMPLANT
SCREW LOCK CORT STAR 3.5X24 (Screw) ×3 IMPLANT
SCREW LOW PROF TIS 3.5X28MM (Screw) ×6 IMPLANT
SCREW LP NL T15 3.5X24 (Screw) ×12 IMPLANT
SCREW LP NL T15 3.5X26 (Screw) ×3 IMPLANT
SCREW T15 LP CORT 3.5X44MM NS (Screw) ×3 IMPLANT
SLEEVE MEASURING 3.2 (BIT) ×3 IMPLANT
SPONGE LAP 18X18 X RAY DECT (DISPOSABLE) IMPLANT
STAPLER VISISTAT 35W (STAPLE) ×3 IMPLANT
STOCKINETTE IMPERVIOUS LG (DRAPES) ×3 IMPLANT
SUCTION FRAZIER TIP 10 FR DISP (SUCTIONS) ×3 IMPLANT
SUT ETHIBOND 5 LR DA (SUTURE) ×3 IMPLANT
SUT FIBERWIRE #2 38 T-5 BLUE (SUTURE)
SUT PDS AB 2-0 CT1 27 (SUTURE) IMPLANT
SUT VIC AB 0 CT1 27 (SUTURE) ×4
SUT VIC AB 0 CT1 27XBRD ANBCTR (SUTURE) ×2 IMPLANT
SUT VIC AB 2-0 CT1 27 (SUTURE) ×4
SUT VIC AB 2-0 CT1 TAPERPNT 27 (SUTURE) ×2 IMPLANT
SUT VIC AB 2-0 CT3 27 (SUTURE) IMPLANT
SUTURE FIBERWR #2 38 T-5 BLUE (SUTURE) IMPLANT
SYR 5ML LL (SYRINGE) IMPLANT
TOWEL OR 17X24 6PK STRL BLUE (TOWEL DISPOSABLE) ×3 IMPLANT
TOWEL OR 17X26 10 PK STRL BLUE (TOWEL DISPOSABLE) ×6 IMPLANT
TRAY FOLEY CATH 16FRSI W/METER (SET/KITS/TRAYS/PACK) IMPLANT
WATER STERILE IRR 1000ML POUR (IV SOLUTION) ×3 IMPLANT
YANKAUER SUCT BULB TIP NO VENT (SUCTIONS) IMPLANT

## 2015-07-19 NOTE — Anesthesia Preprocedure Evaluation (Signed)
Anesthesia Evaluation  Patient identified by MRN, date of birth, ID band Patient awake    Reviewed: Allergy & Precautions, NPO status , Patient's Chart, lab work & pertinent test results  Airway Mallampati: II   Neck ROM: full    Dental   Pulmonary COPD, Current Smoker,    breath sounds clear to auscultation       Cardiovascular negative cardio ROS   Rhythm:regular Rate:Normal     Neuro/Psych Anxiety    GI/Hepatic   Endo/Other  obese  Renal/GU      Musculoskeletal  (+) Fibromyalgia -  Abdominal   Peds  Hematology   Anesthesia Other Findings   Reproductive/Obstetrics                             Anesthesia Physical Anesthesia Plan  ASA: II  Anesthesia Plan: General   Post-op Pain Management:    Induction: Intravenous  Airway Management Planned: Oral ETT  Additional Equipment:   Intra-op Plan:   Post-operative Plan: Extubation in OR  Informed Consent: I have reviewed the patients History and Physical, chart, labs and discussed the procedure including the risks, benefits and alternatives for the proposed anesthesia with the patient or authorized representative who has indicated his/her understanding and acceptance.     Plan Discussed with: CRNA, Anesthesiologist and Surgeon  Anesthesia Plan Comments:         Anesthesia Quick Evaluation

## 2015-07-19 NOTE — Progress Notes (Signed)
PT Cancellation Note  Patient Details Name: Toy Bakerden Stengenga-Amick MRN: [id number] DOB: 03/05/1971   Cancelled Treatment:    Reason Eval/Treat Not Completed: Medical issues which prohibited therapy;Other (comment) (Going for surgery today at 1pm per nurse.  Pt not ready for PT per nurse.  Will check back tomorrow.  Thanks.)   Tawni MillersWhite, Shinichi Anguiano F 07/19/2015, 11:01 AM Eber Jonesawn Toniyah Dilmore,PT Acute Rehabilitation [phone] (pager)

## 2015-07-19 NOTE — Progress Notes (Addendum)
PACU tech came to take IV pump for patient. Patient being transferred to 5N. Dilaudid PCA still set up on IV pump. Attempted to called 5N to notify accepting RN that Dilaudid PCA already set up on pump. No answer to 5N nurse's station. Will make another attempt. Charge Nurse Tanya notified of this information.  Asher Muir- Jackalynn Art,RN    1854: Spoke with Nicole Cellaorothy, RN regarding above information. Patient also left behind a phone charger and a tube of blistex. I will drop these items off to 5N28. Asher Muir- Kingstyn Deruiter,RN

## 2015-07-19 NOTE — Progress Notes (Addendum)
Morphine PCA wasted.  Wasted 13ml.  Witnessed by Alwyn RenJamie Nicholette Dolson, RN.  Allayne ButcherMiller, Kenneth [facility]  07/19/2015  10:03 AM   Lavona MoundJamie Kang Ishida,RN witnessed this waste.  Asher Muir- Cambelle Suchecki,RN

## 2015-07-19 NOTE — Progress Notes (Signed)
Aspen collar placed per MD order with two nurses and NT. Full neck stabilization performed during collar change.

## 2015-07-19 NOTE — Progress Notes (Signed)
Utilization review completed. Brynlei Klausner, RN, BSN. 

## 2015-07-19 NOTE — Progress Notes (Signed)
Patient ID: Lori Dickerson, female   DOB: 05/26/1971, 44 y.o.   MRN: [id number] I reviewed her other extremity x-rays.  She does not have a right radial head fracture and there are no fractures involving her right knee or ankle.  From an orthopedic standpoint, she does have a segmental left humerus shaft fracture and a left 5th metacarpal base fracture.  I will consult Dr. Carola FrostHandy, Ortho Trauma, for his assistance with treating the humerus.
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
## 2015-07-19 NOTE — Anesthesia Postprocedure Evaluation (Signed)
Anesthesia Post Note  Patient: Lori Dickerson  Procedure(s) Performed: Procedure(s) (LRB): OPEN REDUCTION INTERNAL FIXATION (ORIF) PROXIMAL HUMERUS FRACTURE (Left)  Anesthesia type: general  Patient location: PACU  Post pain: Pain level controlled  Post assessment: Patient's Cardiovascular Status Stable  Last Vitals:  Filed Vitals:   07/19/15 1830  BP: 127/50  Pulse: 85  Temp:   Resp: 21    Post vital signs: Reviewed and stable  Level of consciousness: sedated  Complications: No apparent anesthesia complications

## 2015-07-19 NOTE — Anesthesia Procedure Notes (Signed)
Procedure Name: Intubation Date/Time: 07/19/2015 1:27 PM Performed by: Darcey NoraJAMES, Thena Devora B Pre-anesthesia Checklist: Patient identified, Emergency Drugs available, Suction available and Patient being monitored Patient Re-evaluated:Patient Re-evaluated prior to inductionOxygen Delivery Method: Circle system utilized Preoxygenation: Pre-oxygenation with 100% oxygen Intubation Type: IV induction Laryngoscope Size: Mac, 3 and Glidescope (Dr. Chaney MallingHodierne) Grade View: Grade II Tube type: Oral Tube size: 7.5 mm Number of attempts: 1 Airway Equipment and Method: Stylet and Video-laryngoscopy Placement Confirmation: ETT inserted through vocal cords under direct vision,  breath sounds checked- equal and bilateral and positive ETCO2 (Dr. Chaney MallingHodierne) Secured at: 22 (cm at teeth) cm Tube secured with: Tape Dental Injury: Teeth and Oropharynx as per pre-operative assessment

## 2015-07-19 NOTE — Transfer of Care (Signed)
Immediate Anesthesia Transfer of Care Note  Patient: Lori Dickerson  Procedure(s) Performed: Procedure(s): OPEN REDUCTION INTERNAL FIXATION (ORIF) PROXIMAL HUMERUS FRACTURE (Left)  Patient Location: PACU  Anesthesia Type:General  Level of Consciousness: awake and alert   Airway & Oxygen Therapy: Patient Spontanous Breathing and Patient connected to nasal cannula oxygen  Post-op Assessment: Report given to RN, Post -op Vital signs reviewed and stable and Patient moving all extremities X 4  Post vital signs: Reviewed and stable  Last Vitals:  Filed Vitals:   07/19/15 1200  BP:   Pulse:   Temp:   Resp: 18    Complications: No apparent anesthesia complications

## 2015-07-19 NOTE — Brief Op Note (Signed)
07/18/2015 - 07/19/2015  4:51 PM  PATIENT:  Lori Dickerson  44 y.o. female  PRE-OPERATIVE DIAGNOSIS:   1. LEFT PROXIMAL HUMERUS FRACTURE, SURGICAL NECK 2. LEFT COMMINUTED HUMERAL SHAFT FRACTURE  POST-OPERATIVE DIAGNOSIS:   1. LEFT PROXIMAL HUMERUS FRACTURE, SURGICAL NECK 2. LEFT COMMINUTED HUMERAL SHAFT FRACTURE  PROCEDURE:  Procedure(s): 1. OPEN REDUCTION INTERNAL FIXATION (ORIF) PROXIMAL HUMERUS FRACTURE (Left) 2. OPEN REDUCTION INTERNAL FIXATION (ORIF) HUMERAL SHAFT FRACTURE (Left) 3. STRESS FLOURO LEFT WRIST AND ELBOW  SURGEON:  Surgeon(s) and Role:    * Myrene GalasMichael Elia Nunley, MD - Primary  PHYSICIAN ASSISTANT: Montez MoritaKeith Paul, PA-C  ANESTHESIA:   general  I/O:  Total I/O In: 2450 [I.V.:2450] Out: 675 [Urine:325; Blood:350]  SPECIMEN:  No Specimen  TOURNIQUET:  * No tourniquets in log *  DICTATION: .Other Dictation: Dictation Number [id number]
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
## 2015-07-19 NOTE — ED Notes (Signed)
Ortho surgeon at bedside with ortho tech

## 2015-07-20 ENCOUNTER — Inpatient Hospital Stay (HOSPITAL_COMMUNITY): Payer: Medicare Other

## 2015-07-20 ENCOUNTER — Encounter (HOSPITAL_COMMUNITY): Payer: Self-pay | Admitting: Orthopedic Surgery

## 2015-07-20 DIAGNOSIS — D62 Acute posthemorrhagic anemia: Secondary | ICD-10-CM | POA: Diagnosis not present

## 2015-07-20 DIAGNOSIS — G5632 Lesion of radial nerve, left upper limb: Secondary | ICD-10-CM | POA: Diagnosis present

## 2015-07-20 DIAGNOSIS — F17200 Nicotine dependence, unspecified, uncomplicated: Secondary | ICD-10-CM

## 2015-07-20 DIAGNOSIS — S52121A Displaced fracture of head of right radius, initial encounter for closed fracture: Secondary | ICD-10-CM | POA: Diagnosis present

## 2015-07-20 DIAGNOSIS — G8929 Other chronic pain: Secondary | ICD-10-CM | POA: Diagnosis present

## 2015-07-20 DIAGNOSIS — S42362A Displaced segmental fracture of shaft of humerus, left arm, initial encounter for closed fracture: Secondary | ICD-10-CM

## 2015-07-20 DIAGNOSIS — S62307A Unspecified fracture of fifth metacarpal bone, left hand, initial encounter for closed fracture: Secondary | ICD-10-CM

## 2015-07-20 HISTORY — DX: Displaced fracture of head of right radius, initial encounter for closed fracture: S52.121A

## 2015-07-20 HISTORY — DX: Nicotine dependence, unspecified, uncomplicated: F17.200

## 2015-07-20 HISTORY — DX: Unspecified fracture of fifth metacarpal bone, left hand, initial encounter for closed fracture: S62.307A

## 2015-07-20 HISTORY — DX: Displaced segmental fracture of shaft of humerus, left arm, initial encounter for closed fracture: S42.362A

## 2015-07-20 LAB — COMPREHENSIVE METABOLIC PANEL
ALBUMIN: 2.5 g/dL — AB (ref 3.5–5.0)
ALK PHOS: 48 U/L (ref 38–126)
ALT: 18 U/L (ref 14–54)
ANION GAP: 5 (ref 5–15)
AST: 44 U/L — AB (ref 15–41)
CALCIUM: 7.8 mg/dL — AB (ref 8.9–10.3)
CO2: 26 mmol/L (ref 22–32)
Chloride: 104 mmol/L (ref 101–111)
Creatinine, Ser: 0.59 mg/dL (ref 0.44–1.00)
GFR calc Af Amer: >60 mL/min (ref >60)
GFR calc non Af Amer: >60 mL/min (ref >60)
GLUCOSE: 118 mg/dL — AB (ref 65–99)
Potassium: 3.8 mmol/L (ref 3.5–5.1)
SODIUM: 135 mmol/L (ref 135–145)
Total Bilirubin: 0.6 mg/dL (ref 0.3–1.2)
Total Protein: 4.6 g/dL — ABNORMAL LOW (ref 6.5–8.1)

## 2015-07-20 LAB — CBC
HCT: 25.7 % — ABNORMAL LOW (ref 36.0–46.0)
HEMOGLOBIN: 8.5 g/dL — AB (ref 12.0–15.0)
MCH: 32.3 pg (ref 26.0–34.0)
MCHC: 33.1 g/dL (ref 30.0–36.0)
MCV: 97.7 fL (ref 78.0–100.0)
Platelets: 194 10*3/uL (ref 150–400)
RBC: 2.63 MIL/uL — ABNORMAL LOW (ref 3.87–5.11)
RDW: 14.1 % (ref 11.5–15.5)
WBC: 6.5 10*3/uL (ref 4.0–10.5)

## 2015-07-20 MED ORDER — PANTOPRAZOLE SODIUM 40 MG PO TBEC
80.0000 mg | DELAYED_RELEASE_TABLET | Freq: Two times a day (BID) | ORAL | Status: DC
  Administered 2015-07-20 – 2015-07-31 (×23): 80 mg via ORAL
  Filled 2015-07-20 (×23): qty 2

## 2015-07-20 MED ORDER — LEVETIRACETAM 500 MG PO TABS: 2500.0000 mg | ORAL_TABLET | Freq: Every evening | ORAL | Status: DC

## 2015-07-20 MED ORDER — SIMVASTATIN 40 MG PO TABS
40.0000 mg | ORAL_TABLET | Freq: Every day | ORAL | Status: DC
  Administered 2015-07-20 – 2015-07-31 (×12): 40 mg via ORAL
  Filled 2015-07-20 (×12): qty 1

## 2015-07-20 MED ORDER — CITALOPRAM HYDROBROMIDE 20 MG PO TABS
20.0000 mg | ORAL_TABLET | Freq: Every day | ORAL | Status: DC
  Administered 2015-07-20 – 2015-07-31 (×12): 20 mg via ORAL
  Filled 2015-07-20 (×12): qty 1

## 2015-07-20 MED ORDER — OXYCODONE HCL ER 40 MG PO T12A
60.0000 mg | EXTENDED_RELEASE_TABLET | Freq: Two times a day (BID) | ORAL | Status: DC
  Administered 2015-07-20 – 2015-07-21 (×3): 60 mg via ORAL
  Filled 2015-07-20: qty 2
  Filled 2015-07-20 (×2): qty 1
  Filled 2015-07-20 (×2): qty 2
  Filled 2015-07-20: qty 1

## 2015-07-20 MED ORDER — TRAZODONE HCL 50 MG PO TABS
50.0000 mg | ORAL_TABLET | Freq: Two times a day (BID) | ORAL | Status: DC
  Administered 2015-07-20 – 2015-07-28 (×17): 50 mg via ORAL
  Filled 2015-07-20 (×18): qty 1

## 2015-07-20 MED ORDER — CYCLOBENZAPRINE HCL 10 MG PO TABS
10.0000 mg | ORAL_TABLET | Freq: Three times a day (TID) | ORAL | Status: DC | PRN
  Administered 2015-07-20 – 2015-07-30 (×18): 10 mg via ORAL
  Filled 2015-07-20 (×17): qty 1

## 2015-07-20 MED ORDER — LEVETIRACETAM 500 MG PO TABS
1000.0000 mg | ORAL_TABLET | Freq: Once | ORAL | Status: AC
  Administered 2015-07-20: 1000 mg via ORAL
  Filled 2015-07-20: qty 2

## 2015-07-20 MED ORDER — LEVETIRACETAM 500 MG PO TABS: 1500.0000 mg | ORAL_TABLET | Freq: Every morning | ORAL | Status: DC

## 2015-07-20 MED ORDER — LEVETIRACETAM 500 MG PO TABS
1500.0000 mg | ORAL_TABLET | Freq: Two times a day (BID) | ORAL | Status: DC
  Administered 2015-07-20 – 2015-07-31 (×22): 1500 mg via ORAL
  Filled 2015-07-20 (×22): qty 3

## 2015-07-20 MED ORDER — TRAMADOL HCL 50 MG PO TABS
100.0000 mg | ORAL_TABLET | Freq: Four times a day (QID) | ORAL | Status: DC
  Administered 2015-07-20 – 2015-07-31 (×43): 100 mg via ORAL
  Filled 2015-07-20 (×45): qty 2

## 2015-07-20 MED ORDER — HYDROMORPHONE HCL 1 MG/ML IJ SOLN
1.0000 mg | INTRAMUSCULAR | Status: DC | PRN
  Administered 2015-07-20 – 2015-07-25 (×15): 1 mg via INTRAVENOUS
  Filled 2015-07-20 (×17): qty 1

## 2015-07-20 MED ORDER — VALACYCLOVIR HCL 500 MG PO TABS
1000.0000 mg | ORAL_TABLET | Freq: Every day | ORAL | Status: DC
  Administered 2015-07-20 – 2015-07-31 (×12): 1000 mg via ORAL
  Filled 2015-07-20 (×12): qty 2

## 2015-07-20 MED ORDER — MECLIZINE HCL 25 MG PO TABS
25.0000 mg | ORAL_TABLET | Freq: Three times a day (TID) | ORAL | Status: DC | PRN
  Filled 2015-07-20: qty 1

## 2015-07-20 MED ORDER — MONTELUKAST SODIUM 10 MG PO TABS
10.0000 mg | ORAL_TABLET | Freq: Every day | ORAL | Status: DC
  Administered 2015-07-20 – 2015-07-30 (×11): 10 mg via ORAL
  Filled 2015-07-20 (×11): qty 1

## 2015-07-20 MED ORDER — ALBUTEROL SULFATE (2.5 MG/3ML) 0.083% IN NEBU: 2.5000 mg | INHALATION_SOLUTION | Freq: Four times a day (QID) | RESPIRATORY_TRACT | Status: DC | PRN

## 2015-07-20 MED ORDER — GABAPENTIN 600 MG PO TABS
600.0000 mg | ORAL_TABLET | Freq: Four times a day (QID) | ORAL | Status: DC
  Administered 2015-07-20 – 2015-07-31 (×42): 600 mg via ORAL
  Filled 2015-07-20 (×50): qty 1

## 2015-07-20 MED ORDER — OXYCODONE HCL 5 MG PO TABS
10.0000 mg | ORAL_TABLET | ORAL | Status: DC | PRN
  Administered 2015-07-20 – 2015-07-22 (×5): 20 mg via ORAL
  Administered 2015-07-22: 15 mg via ORAL
  Administered 2015-07-22: 20 mg via ORAL
  Administered 2015-07-23: 15 mg via ORAL
  Administered 2015-07-23 – 2015-07-25 (×8): 20 mg via ORAL
  Administered 2015-07-26 (×3): 15 mg via ORAL
  Administered 2015-07-27 (×2): 10 mg via ORAL
  Filled 2015-07-20: qty 4
  Filled 2015-07-20 (×2): qty 2
  Filled 2015-07-20 (×3): qty 4
  Filled 2015-07-20 (×2): qty 3
  Filled 2015-07-20 (×3): qty 4
  Filled 2015-07-20 (×2): qty 3
  Filled 2015-07-20 (×4): qty 4
  Filled 2015-07-20: qty 3
  Filled 2015-07-20 (×4): qty 4

## 2015-07-20 MED ORDER — ENOXAPARIN SODIUM 30 MG/0.3ML ~~LOC~~ SOLN
30.0000 mg | Freq: Two times a day (BID) | SUBCUTANEOUS | Status: DC
  Administered 2015-07-20 – 2015-07-31 (×22): 30 mg via SUBCUTANEOUS
  Filled 2015-07-20 (×23): qty 0.3

## 2015-07-20 MED ORDER — GABAPENTIN 300 MG PO CAPS
300.0000 mg | ORAL_CAPSULE | Freq: Three times a day (TID) | ORAL | Status: DC
  Administered 2015-07-20: 300 mg via ORAL
  Filled 2015-07-20: qty 1

## 2015-07-20 MED ORDER — POLYETHYLENE GLYCOL 3350 17 G PO PACK
17.0000 g | PACK | Freq: Every day | ORAL | Status: DC
  Administered 2015-07-20 – 2015-07-31 (×9): 17 g via ORAL
  Filled 2015-07-20 (×11): qty 1

## 2015-07-20 MED ORDER — AMPHETAMINE-DEXTROAMPHETAMINE 10 MG PO TABS
5.0000 mg | ORAL_TABLET | Freq: Four times a day (QID) | ORAL | Status: DC
Start: 2015-07-20 — End: 2015-07-28
  Administered 2015-07-20 – 2015-07-27 (×15): 10 mg via ORAL
  Filled 2015-07-20 (×19): qty 1

## 2015-07-20 MED ORDER — LEVETIRACETAM 500 MG PO TABS: 1500.0000 mg | ORAL_TABLET | Freq: Every evening | ORAL | Status: DC

## 2015-07-20 MED ORDER — DONEPEZIL HCL 10 MG PO TABS
10.0000 mg | ORAL_TABLET | Freq: Two times a day (BID) | ORAL | Status: DC
  Administered 2015-07-20 – 2015-07-31 (×23): 10 mg via ORAL
  Filled 2015-07-20 (×23): qty 1

## 2015-07-20 NOTE — Progress Notes (Signed)
2 mg of dilaudid PCA was wasted by this RN and Illene SilverGenevieve Adjei-Sakyl, RN at 0320 on 10/18

## 2015-07-20 NOTE — Progress Notes (Signed)
Rehab Admissions Coordinator Note:  Patient was screened by Clois DupesBoyette, Burech Mcfarland Godwin for appropriateness for an Inpatient Acute Rehab Consult per PT and OT recommendations.  At this time, we are recommending Inpatient Rehab consult. Please place order for consult.  Clois DupesBoyette, Sanaia Jasso Godwin 07/20/2015, 6:12 PM  I can be reached at [phone].

## 2015-07-20 NOTE — Progress Notes (Signed)
Told patient of the order to d/c her foley.  After talking to patient of increased risk of infection, she was adamant to keep foley until the braces for her ankles arrived.  Nsg to continue to monitor.
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
## 2015-07-20 NOTE — Progress Notes (Signed)
Orthopedic Tech Progress Note Patient Details:  Toy Bakerden Stengenga-Amick 06/13/1971 [id number]  Patient ID: Toy BakerEden Stengenga-Amick, female   DOB: 03/27/1971, 44 y.o.   MRN: [id number] Called in advanced brace order; spoke with Christin BachJulia  Raymonde Hamblin 07/20/2015, 12:30 PM
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
## 2015-07-20 NOTE — Progress Notes (Signed)
Orthopedic Tech Progress Note Patient Details:  Lori Dickerson 05/12/1971 [id number] Brace order completed by [employer]. Patient ID: Lori BakerEden Dickerson, female   DOB: 11/23/1970, 44 y.o.   MRN: [id number]   [name], [name] 07/20/2015, 10:28 PM
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
## 2015-07-20 NOTE — Progress Notes (Signed)
OT Cancellation Note  Patient Details Name: Lori Dickerson MRN: [id number] DOB: 08/29/1971   Cancelled Treatment:    Reason Eval/Treat Not Completed: Pain limiting ability to participate. Pt reports she is in significant pain and will not work with therapy until pain is managed. RN in room and aware of need for pain medication. Provided pt and daughter with shoulder precaution handout and asked them to look over information for when OT returns; pt and daughter agreed. Will check back for OT eval as time allows and pt is appropriate.   Gaye AlkenBailey A Deira Shimer M.S., OTR/L Pager: [phone]  07/20/2015, 1:44 PM

## 2015-07-21 ENCOUNTER — Encounter (HOSPITAL_COMMUNITY): Payer: Self-pay | Admitting: Orthopedic Surgery

## 2015-07-21 ENCOUNTER — Inpatient Hospital Stay (HOSPITAL_COMMUNITY): Payer: Medicare Other

## 2015-07-21 DIAGNOSIS — S42302A Unspecified fracture of shaft of humerus, left arm, initial encounter for closed fracture: Secondary | ICD-10-CM

## 2015-07-21 DIAGNOSIS — G5632 Lesion of radial nerve, left upper limb: Secondary | ICD-10-CM

## 2015-07-21 DIAGNOSIS — S62501A Fracture of unspecified phalanx of right thumb, initial encounter for closed fracture: Secondary | ICD-10-CM | POA: Diagnosis present

## 2015-07-21 DIAGNOSIS — S62307A Unspecified fracture of fifth metacarpal bone, left hand, initial encounter for closed fracture: Secondary | ICD-10-CM

## 2015-07-21 DIAGNOSIS — G8929 Other chronic pain: Secondary | ICD-10-CM

## 2015-07-21 DIAGNOSIS — J939 Pneumothorax, unspecified: Secondary | ICD-10-CM

## 2015-07-21 DIAGNOSIS — D62 Acute posthemorrhagic anemia: Secondary | ICD-10-CM

## 2015-07-21 LAB — VITAMIN D 25 HYDROXY (VIT D DEFICIENCY, FRACTURES): VIT D 25 HYDROXY: 20.9 ng/mL — AB (ref 30.0–100.0)

## 2015-07-21 LAB — CBC
HCT: 24.9 % — ABNORMAL LOW (ref 36.0–46.0)
Hemoglobin: 8.1 g/dL — ABNORMAL LOW (ref 12.0–15.0)
MCH: 31.8 pg (ref 26.0–34.0)
MCHC: 32.5 g/dL (ref 30.0–36.0)
MCV: 97.6 fL (ref 78.0–100.0)
PLATELETS: 209 10*3/uL (ref 150–400)
RBC: 2.55 MIL/uL — ABNORMAL LOW (ref 3.87–5.11)
RDW: 13.8 % (ref 11.5–15.5)
WBC: 7.4 10*3/uL (ref 4.0–10.5)

## 2015-07-21 MED ORDER — VITAMIN D (ERGOCALCIFEROL) 1.25 MG (50000 UNIT) PO CAPS
50000.0000 [IU] | ORAL_CAPSULE | ORAL | Status: DC
  Administered 2015-07-21 – 2015-07-28 (×2): 50000 [IU] via ORAL
  Filled 2015-07-21 (×2): qty 1

## 2015-07-21 MED ORDER — VITAMIN C 500 MG PO TABS
500.0000 mg | ORAL_TABLET | Freq: Every day | ORAL | Status: DC
  Administered 2015-07-21 – 2015-07-31 (×11): 500 mg via ORAL
  Filled 2015-07-21 (×10): qty 1

## 2015-07-21 MED ORDER — OXYCODONE HCL ER 40 MG PO T12A
80.0000 mg | EXTENDED_RELEASE_TABLET | Freq: Two times a day (BID) | ORAL | Status: DC
  Administered 2015-07-21 – 2015-07-31 (×20): 80 mg via ORAL
  Filled 2015-07-21 (×20): qty 2

## 2015-07-21 MED ORDER — VITAMIN D 1000 UNITS PO TABS
1000.0000 [IU] | ORAL_TABLET | Freq: Two times a day (BID) | ORAL | Status: DC
  Administered 2015-07-21 – 2015-07-31 (×20): 1000 [IU] via ORAL
  Filled 2015-07-21 (×20): qty 1

## 2015-07-21 MED ORDER — OXYCODONE HCL ER 10 MG PO T12A
20.0000 mg | EXTENDED_RELEASE_TABLET | Freq: Once | ORAL | Status: AC
  Administered 2015-07-21: 20 mg via ORAL
  Filled 2015-07-21: qty 2
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
## 2015-07-21 NOTE — Progress Notes (Signed)
Pt is insisting to see her mother called Dr Carola FrostHandy to discuss this being 3 days since mom has past
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
## 2015-07-21 NOTE — Clinical Social Work Note (Signed)
CSW spoke with morgue. They advise that the patient NOT view the mother's body in the morgue. They suggest that the family have the mother's body taken to a funeral home of the family's choice and then allow the patient to view the body there. CSW has relayed this information to RN. Examiner states that he can assist with explaining this to the patient/family if the patient continues to insist on viewing the body. His contact information ([phone]) has been given to RN. CSW also spoke with chaplain. Chaplain can assist if needed. Chaplain pager also provided to RN ([phone]).   Lori Dickerson Lori Dickerson MSW, [place] Run[place]CSW, [place], [phone]
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
## 2015-07-21 NOTE — Progress Notes (Signed)
I will follow up with pt and daughter tomorrow to discuss rehab venue options depending on her caregiver support at home and tolerance to participate with therapies. [phone]

## 2015-07-21 NOTE — Progress Notes (Signed)
   07/21/15 1703  Clinical Encounter Type  Visited With Patient;Health care provider  Visit Type Initial  Referral From Nurse;Patient  Recommendations Social Work  Spiritual Encounters  Spiritual Needs Emotional;Grief support  Stress Factors  Patient Stress Factors Family relationships;Loss;Health changes    Chaplain responded to a request from the patient to see her mother in the morgue. Chaplain offered support. Patient expressed concerns and lack of knowledge about potentially becoming in charge of her mom's estate, so chaplain is recommending a social work consult as they have some more information about how those processes unfold. Patient was also worried about her mom's burial, as the family does not believe in embalming and there are some issues around cremation. Chaplain left information regarding [place]friendly [place]. Chaplain will seek to follow-up, and our support is available as needed.   Alda PonderAdam M Sherra Kimmons, Chaplain 07/21/2015 5:10 PM

## 2015-07-21 NOTE — Progress Notes (Signed)
Pt went to see mom with house admistrator and NT

## 2015-07-22 MED ORDER — ACETAMINOPHEN 325 MG PO TABS
650.0000 mg | ORAL_TABLET | Freq: Four times a day (QID) | ORAL | Status: DC | PRN
  Administered 2015-07-22 – 2015-07-31 (×4): 650 mg via ORAL
  Filled 2015-07-22 (×4): qty 2

## 2015-07-22 MED ORDER — WHITE PETROLATUM GEL
Status: AC
Start: 2015-07-22 — End: 2015-07-23
  Filled 2015-07-22: qty 1

## 2015-07-22 NOTE — Progress Notes (Signed)
   07/22/15 1517  Clinical Encounter Type  Visited With Patient  Visit Type Follow-up  Referral From Patient  Consult/Referral To Social work  Spiritual Encounters  Spiritual Needs Emotional  Stress Factors  Patient Stress Factors Lack of knowledge;Loss   Chaplain followed up with patient, who expressed interest in meeting with social work to discuss the process going forward with her mother's estate, and perhaps questions about her mom's burial arrangements and her being able to be present and participate in that. Chaplain left a note with CSW office for the social worker mentioned that the patient mentioned to the chaplain. Patient also requested a Bible, and chaplain retrieved one for her. Chaplain support available as needed.   Alda PonderAdam M Kdyn Vonbehren, Chaplain 07/22/2015 3:22 PM
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
## 2015-07-22 NOTE — Progress Notes (Signed)
Ask by Trauma CSW to f/u with pt re: questions related to her mother/mother's funeral.  Pt concerned about how long her mother's body can remain in the morgue, concerns re: her aunt wanting to have her mother cremated (not what her mother wanted) and family history of not having their loved one's embalmed.  Pt also had questions about becoming executor of her mother's estate and wanting to assist in preparing her mother's body for burial.  CSW explained that there is not a specified number of days a body can remain in the morgue, but releasing it to a funeral home sooner, rather than later, is preferred.  Explained that pt is her mother's NOK (only child) (no POA) so she would have the right/responsibility to decide on how pt's burial would be handled.  Pt encouraged to call a funeral home to see if they allowed family to assist in preparation of the body for burial, as we did not allow that here in our morgue.  Pt referred to an attorney for questions re: her mother's estate/becoming executor.  CSW continues to be available on behalf of pt/assist with disposition.  Pollyann SavoyJody Kalee [place], [place] [phone]

## 2015-07-22 NOTE — Progress Notes (Signed)
Chaplain was assessed by chaplain that a page may come for this Pt. Chaplain was paged and was told tey would be met on the ground level. Chaplain waited by staff elevator on the ground level for about 30 minutes and did not see security and Pt. Chaplain visited the Pt in the room and talked with Pt about her feeling now that she has seen her deceased mother. The Pt seemed in a good place in the regarding her mother but there seem to be other family dynamics in play. Pt was concerned about how to memorialize her mother since they don't believe in embalming. I asked what faith tradition are they honoring and she didn't know. It was their tradition. She was concerned about cleaning her mother up and was told she would be cleaned but the Pt felt a responiibility to do it herself I suggest this Pt continue to be followed.    07/22/15 0800  Clinical Encounter Type  Visited With Patient  Visit Type Spiritual support  Referral From Chaplain  Spiritual Encounters  Spiritual Needs Prayer;Emotional  Stress Factors  Patient Stress Factors Loss
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
## 2015-07-23 ENCOUNTER — Inpatient Hospital Stay (HOSPITAL_COMMUNITY): Payer: Medicare Other

## 2015-07-23 LAB — COMPREHENSIVE METABOLIC PANEL
ALBUMIN: 2.3 g/dL — AB (ref 3.5–5.0)
ALT: 16 U/L (ref 14–54)
ANION GAP: 8 (ref 5–15)
AST: 29 U/L (ref 15–41)
Alkaline Phosphatase: 54 U/L (ref 38–126)
BUN: <5 mg/dL — ABNORMAL LOW (ref 6–20)
CO2: 28 mmol/L (ref 22–32)
Calcium: 8.6 mg/dL — ABNORMAL LOW (ref 8.9–10.3)
Chloride: 102 mmol/L (ref 101–111)
Creatinine, Ser: 0.56 mg/dL (ref 0.44–1.00)
GFR calc Af Amer: >60 mL/min (ref >60)
GFR calc non Af Amer: >60 mL/min (ref >60)
GLUCOSE: 128 mg/dL — AB (ref 65–99)
POTASSIUM: 3.3 mmol/L — AB (ref 3.5–5.1)
SODIUM: 138 mmol/L (ref 135–145)
Total Bilirubin: 0.4 mg/dL (ref 0.3–1.2)
Total Protein: 5.2 g/dL — ABNORMAL LOW (ref 6.5–8.1)

## 2015-07-23 LAB — CBC
HCT: 26.3 % — ABNORMAL LOW (ref 36.0–46.0)
Hemoglobin: 8.6 g/dL — ABNORMAL LOW (ref 12.0–15.0)
MCH: 31.7 pg (ref 26.0–34.0)
MCHC: 32.7 g/dL (ref 30.0–36.0)
MCV: 97 fL (ref 78.0–100.0)
PLATELETS: 255 10*3/uL (ref 150–400)
RBC: 2.71 MIL/uL — ABNORMAL LOW (ref 3.87–5.11)
RDW: 13.8 % (ref 11.5–15.5)
WBC: 7.1 10*3/uL (ref 4.0–10.5)

## 2015-07-23 LAB — MAGNESIUM: Magnesium: 1.6 mg/dL — ABNORMAL LOW (ref 1.7–2.4)

## 2015-07-23 MED ORDER — POLYETHYLENE GLYCOL 3350 17 G PO PACK
17.0000 g | PACK | Freq: Every day | ORAL | Status: DC
  Administered 2015-07-23: 17 g via ORAL
  Filled 2015-07-23 (×2): qty 1

## 2015-07-23 MED ORDER — FLEET ENEMA 7-19 GM/118ML RE ENEM
1.0000 | ENEMA | Freq: Once | RECTAL | Status: AC
  Administered 2015-07-24: 1 via RECTAL
  Filled 2015-07-23: qty 1

## 2015-07-23 MED ORDER — BISACODYL 10 MG RE SUPP: 10.0000 mg | Freq: Every day | RECTAL | Status: DC | PRN

## 2015-07-23 MED ORDER — BISACODYL 5 MG PO TBEC: 5.0000 mg | DELAYED_RELEASE_TABLET | Freq: Every day | ORAL | Status: DC | PRN

## 2015-07-23 MED ORDER — BACITRACIN ZINC 500 UNIT/GM EX OINT
TOPICAL_OINTMENT | Freq: Two times a day (BID) | CUTANEOUS | Status: DC
  Administered 2015-07-23 – 2015-07-31 (×16): via TOPICAL
  Filled 2015-07-23 (×2): qty 28.35

## 2015-07-23 MED ORDER — POTASSIUM CHLORIDE IN NACL 20-0.9 MEQ/L-% IV SOLN
INTRAVENOUS | Status: DC
  Administered 2015-07-23 – 2015-07-24 (×2): via INTRAVENOUS
  Filled 2015-07-23 (×3): qty 1000

## 2015-07-23 MED ORDER — METOCLOPRAMIDE HCL 5 MG/ML IJ SOLN
10.0000 mg | Freq: Three times a day (TID) | INTRAMUSCULAR | Status: AC
  Administered 2015-07-23 – 2015-07-24 (×5): 10 mg via INTRAVENOUS
  Filled 2015-07-23 (×5): qty 2
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
## 2015-07-23 NOTE — Clinical Social Work Note (Signed)
Clinical Social Worker continuing to follow patient and family for support and discharge planning needs.  CSW met with patient at bedside and spoke at length regarding potential discharge needs.  Patient states that she is working with family to develop a 24 hour plan at home and has been participating with therapies.  Patient is agreeable to SNF search in [place], [place], and [place] but has preference to inpatient rehab.  CSW notified inpatient rehab admissions coordinator of patient desire for CIR and also notified CM of the potential for inpatient rehab at [facility] per patient request.  CSW to initiate SNF search and follow up with patient regarding available bed offers.  Per patient, she has Medicare and Medicaid coverage - CSW able to verify Medicare ([id number]).  CSW remains available for support and to facilitate patient discharge needs once medically stable.  Barbette Or, [place]
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
## 2015-07-23 NOTE — Progress Notes (Signed)
CSW following to facilitate possible dc to SNF when medically stable.  Pt has no insurance; will be difficult placement.    Quintella BatonJulie W. Adrinne Sze, RN, BSN  Trauma/Neuro ICU Case Manager [phone]

## 2015-07-24 LAB — VITAMIN D 1,25 DIHYDROXY
VITAMIN D3 1, 25 (OH): 19 pg/mL
Vitamin D 1, 25 (OH)2 Total: 27 pg/mL
Vitamin D2 1, 25 (OH)2: <10 pg/mL

## 2015-07-24 LAB — BASIC METABOLIC PANEL
ANION GAP: 7 (ref 5–15)
CHLORIDE: 96 mmol/L — AB (ref 101–111)
CO2: 32 mmol/L (ref 22–32)
Calcium: 8.4 mg/dL — ABNORMAL LOW (ref 8.9–10.3)
Creatinine, Ser: 0.5 mg/dL (ref 0.44–1.00)
GFR calc Af Amer: >60 mL/min (ref >60)
Glucose, Bld: 99 mg/dL (ref 65–99)
POTASSIUM: 3.4 mmol/L — AB (ref 3.5–5.1)
SODIUM: 135 mmol/L (ref 135–145)

## 2015-07-24 LAB — MAGNESIUM: MAGNESIUM: 1.6 mg/dL — AB (ref 1.7–2.4)

## 2015-07-24 MED ORDER — MAGNESIUM SULFATE 2 GM/50ML IV SOLN
2.0000 g | Freq: Once | INTRAVENOUS | Status: AC
  Administered 2015-07-24: 2 g via INTRAVENOUS
  Filled 2015-07-24: qty 50

## 2015-07-24 MED ORDER — POTASSIUM CHLORIDE CRYS ER 20 MEQ PO TBCR
40.0000 meq | EXTENDED_RELEASE_TABLET | Freq: Once | ORAL | Status: AC
  Administered 2015-07-24: 40 meq via ORAL
  Filled 2015-07-24: qty 2

## 2015-07-24 MED ORDER — SODIUM CHLORIDE 0.9 % IV SOLN
1.0000 g | Freq: Once | INTRAVENOUS | Status: DC
  Filled 2015-07-24: qty 10

## 2015-07-24 NOTE — Progress Notes (Signed)
[facility] at [facility]. Contacted by SW to assess ability to admit this pt. Pt will need 24/7 assist at home finalized before I can look into [facility]. I will follow up on Monday. [phone]

## 2015-07-24 NOTE — Progress Notes (Signed)
OT Cancellation Note  Patient Details Name: Lori Dickerson MRN: [id number] DOB: [birth year]   Cancelled Treatment:    Reason Eval/Treat Not Completed: Fatigue/lethargy limiting ability to participate.  Pt. In deep sleep and unable to awaken for skilled therapy.  Pts. Family member met me at the door stating that the pt. And her "need her to stay here for therapy, and were being told that ya'll want her sent out to skilled nursing facility and that can not happen.  If she goes somewhere else then she will decline.  She needs to be re-evaluated".  i attempted to explain that she had already been evaluated and was currently on our caseload and if she was indicated or thought to be a candidate for CIR that those recommendations would be made.  Family member continued to interrupt me and say how she "had to be re-evaluated".  i attempted multiple times to explain that she was already being seen.  i also explained that no one can guarantee CIR level therapies because it depends on how pt. Physically presents and insurance coverage/approval.  The family member then went on to say that "look at her look how medicated she is, i think if she can be less medicated then next time yall see her you would see how determined she is, i think yall evaluated her when she was like this"  i attempted again to explain that all recommendations are made on how pt. Physically presents and demonstrates during eval and tx. Sessions.  Family member began to calm down a little and thanked me for explaining things to her.  i reviewed that i understood that what she was requesting is that the pt. Be seen again and to see what the recommendations are for next therapy venue.  She agreed.   i did state one more time that there is no guarantee for the pt. To receive where they want to go for continued therapy that there are other factors in place.  She verbalized understanding.    Janice Coffin, COTA/L 07/24/2015, 10:26 AM
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
## 2015-07-24 NOTE — Clinical Social Work Placement (Addendum)
   CLINICAL SOCIAL WORK PLACEMENT  NOTE  Date:  07/24/2015  Patient Details  Name: Lori Dickerson MRN: [id number] Date of Birth: 08/22/1971  Clinical Social Work is seeking post-discharge placement for this patient at the Skilled  Nursing Facility level of care (*CSW will initial, date and re-position this form in  chart as items are completed):  Yes   Patient/family provided with [facility] Clinical Social Work Department's list of facilities offering this level of care within the geographic area requested by the patient (or if unable, by the patient's family).  Yes   Patient/family informed of their freedom to choose among providers that offer the needed level of care, that participate in Medicare, Medicaid or managed care program needed by the patient, have an available bed and are willing to accept the patient.  Yes   Patient/family informed of [facility]'s ownership interest in [facility] and [facility], as well as of the fact that they are under no obligation to receive care at these facilities.  PASRR submitted to EDS on 07/24/15     PASRR number received on       Existing PASRR number confirmed on       FL2 transmitted to all facilities in geographic area requested by pt/family on 07/24/15     FL2 transmitted to all facilities within larger geographic area on 07/24/15     Patient informed that his/her managed care company has contracts with or will negotiate with certain facilities, including the following:         07/28/15   Patient/family informed of bed offers received.  [facility](Brettany Sydney, MSW, [place], updated 07/31/15)  Patient chooses bed at  [facility]  (Windell MouldingEric Barak Bialecki, MSW, [place], updated 07/31/15)    Physician recommends and patient chooses bed at      [facility]  [facility] on  07/31/15.  [facility](Sathvik Tiedt, MSW, [place], updated 07/31/15)  Patient to be transferred to facility by  [facility]  [facility](Tinaya Ceballos, MSW, [place],  updated 07/31/15)   Patient family notified on  07/31/15 of transfer.   [facility](Auburn Hester, MSW, [place], updated 07/31/15)  Name of family member notified:   Patient's aunt Marzetta MerinoDonntte Miller  [facility](Bryson Gavia, MSW, [place], updated 07/31/15)    PHYSICIAN Please sign FL2     Additional Comment:    _______________________________________________ Raye Sorrowoble, Hannah N, LCSW 07/24/2015, 1:44 PM   Ervin KnackEric R. Hassan Rowannterhaus, MSW, Theresia MajorsLCSWA [phone] 07/31/2015 2:49 PM  (Windell MouldingEric Ramere Downs, MSW, [place], updated 07/31/15)

## 2015-07-25 LAB — MAGNESIUM: MAGNESIUM: 1.7 mg/dL (ref 1.7–2.4)

## 2015-07-25 MED ORDER — POTASSIUM CHLORIDE CRYS ER 20 MEQ PO TBCR
40.0000 meq | EXTENDED_RELEASE_TABLET | Freq: Once | ORAL | Status: AC
  Administered 2015-07-25: 40 meq via ORAL
  Filled 2015-07-25: qty 2

## 2015-07-25 MED ORDER — HYDROMORPHONE HCL 1 MG/ML IJ SOLN
0.5000 mg | INTRAMUSCULAR | Status: DC | PRN
  Administered 2015-07-25 – 2015-07-27 (×9): 0.5 mg via INTRAVENOUS
  Filled 2015-07-25 (×9): qty 1
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
## 2015-07-25 NOTE — Progress Notes (Signed)
Pt continues to complain of blurred vision and spots in her eyes. Is requesting eye exam.
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
## 2015-07-26 LAB — URINALYSIS, ROUTINE W REFLEX MICROSCOPIC
Bilirubin Urine: NEGATIVE
Glucose, UA: NEGATIVE mg/dL
Ketones, ur: NEGATIVE mg/dL
LEUKOCYTES UA: NEGATIVE
NITRITE: NEGATIVE
Protein, ur: NEGATIVE mg/dL
SPECIFIC GRAVITY, URINE: 1.007 (ref 1.005–1.030)
UROBILINOGEN UA: 1 mg/dL (ref 0.0–1.0)
pH: 7.5 (ref 5.0–8.0)

## 2015-07-26 LAB — URINE MICROSCOPIC-ADD ON

## 2015-07-26 MED ORDER — DOCUSATE SODIUM 100 MG PO CAPS
100.0000 mg | ORAL_CAPSULE | Freq: Two times a day (BID) | ORAL | Status: DC
  Administered 2015-07-26 – 2015-07-31 (×11): 100 mg via ORAL
  Filled 2015-07-26 (×11): qty 1
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
## 2015-07-26 NOTE — Progress Notes (Deleted)
Pt made RN aware or red areas on both hip areas. Areas appear splotchy and slightly raised. Pt states the areas are mildly tender and itchy, and that she has only noticed them within the past day or so. Will continue to monitor and notify if areas get worse. Katelin E Sawicki, RN 

## 2015-07-26 NOTE — Progress Notes (Signed)
Pt made RN aware or red areas on both hip areas. Areas appear splotchy and slightly raised. Pt states the areas are mildly tender and itchy, and that she has only noticed them within the past day or so. Will continue to monitor and notify if areas get worse. Sedonia SmallKatelin E Sawicki, RN

## 2015-07-26 NOTE — Progress Notes (Signed)
OT Cancellation Note  Patient Details Name: Lori Dickerson MRN: [id number] DOB: 09/30/1971   Cancelled Treatment:    Reason Eval/Treat Not Completed: Patient declined, no reason specified (pt still eating lunch, declined therapy and getting OOB ). Pt reports she has been completing elbow to hand exercises 3 x per day on her own; increased ROM noted. Pt more lethargic today with slurred speech and falling asleep x 2 while talking with therapist; RN notified. Pt c/o poor service (not getting pain medication and taking too long when she needs to go to the bathroom); therapist expressed understanding of concerns, educated pt on calling RN and NT directly when needing to go to the bathroom, educated on not getting [facility]OB [facility] without [facility] staff in the room assisting; pt verbalized understanding. Will check back for OT session as time allows and pt is appropriate.   Gaye AlkenBailey A Shaterrica Territo M.S., OTR/L Pager: [phone]  07/26/2015, 4:11 PM

## 2015-07-26 NOTE — Progress Notes (Signed)
   07/26/15 1512  Clinical Encounter Type  Visited With Patient and family together;Patient;Health care provider  Visit Type Follow-up;Spiritual support  Spiritual Encounters  Spiritual Needs Prayer;Emotional;Grief support  Stress Factors  Patient Stress Factors Exhausted;Health changes;Loss   Chaplain followed up with patient to offer support. Patient and chaplain facilitated life review, and patient expressed concerns about new vision issues. Patient expressed feelings of fear and guilt. Chaplain offered prayer and support. Our support is available as needed.   Alda PonderAdam M Saivon Prowse, Chaplain 07/26/2015 3:14 PM

## 2015-07-27 MED ORDER — OXYCODONE HCL 5 MG PO TABS
20.0000 mg | ORAL_TABLET | ORAL | Status: DC
  Administered 2015-07-27 – 2015-07-29 (×12): 20 mg via ORAL
  Filled 2015-07-27 (×12): qty 4

## 2015-07-27 MED ORDER — LORATADINE 10 MG PO TABS
10.0000 mg | ORAL_TABLET | Freq: Every day | ORAL | Status: DC
  Administered 2015-07-27 – 2015-07-31 (×5): 10 mg via ORAL
  Filled 2015-07-27 (×5): qty 1

## 2015-07-27 MED ORDER — INFLUENZA VAC SPLIT QUAD 0.5 ML IM SUSY
0.5000 mL | PREFILLED_SYRINGE | INTRAMUSCULAR | Status: AC
  Administered 2015-07-28: 0.5 mL via INTRAMUSCULAR
  Filled 2015-07-27: qty 0.5
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
## 2015-07-28 DIAGNOSIS — S42352A Displaced comminuted fracture of shaft of humerus, left arm, initial encounter for closed fracture: Secondary | ICD-10-CM | POA: Diagnosis not present

## 2015-07-28 MED ORDER — VITAMIN D (ERGOCALCIFEROL) 1.25 MG (50000 UNIT) PO CAPS: 50000.0000 [IU] | ORAL_CAPSULE | ORAL | Status: AC

## 2015-07-28 MED ORDER — BUTALBITAL-APAP-CAFFEINE 50-325-40 MG PO TABS
1.0000 | ORAL_TABLET | Freq: Two times a day (BID) | ORAL | Status: DC | PRN
  Administered 2015-07-28: 2 via ORAL
  Filled 2015-07-28: qty 2

## 2015-07-28 MED ORDER — OXYCODONE HCL 20 MG PO TABS: 20.0000 mg | ORAL_TABLET | ORAL | Status: DC | PRN

## 2015-07-28 MED ORDER — TRAMADOL HCL 50 MG PO TABS: 100.0000 mg | ORAL_TABLET | Freq: Four times a day (QID) | ORAL | Status: AC

## 2015-07-28 MED ORDER — OXYCODONE HCL ER 80 MG PO T12A: 80.0000 mg | EXTENDED_RELEASE_TABLET | Freq: Two times a day (BID) | ORAL | Status: AC

## 2015-07-28 MED ORDER — DOCUSATE SODIUM 100 MG PO CAPS: 100.0000 mg | ORAL_CAPSULE | Freq: Two times a day (BID) | ORAL | Status: AC | PRN

## 2015-07-28 MED ORDER — BACITRACIN ZINC 500 UNIT/GM EX OINT: TOPICAL_OINTMENT | Freq: Two times a day (BID) | CUTANEOUS | Status: AC

## 2015-07-28 MED ORDER — ACETAMINOPHEN 325 MG PO TABS: 650.0000 mg | ORAL_TABLET | Freq: Four times a day (QID) | ORAL | Status: AC | PRN

## 2015-07-28 MED ORDER — MAGIC MOUTHWASH
15.0000 mL | Freq: Four times a day (QID) | ORAL | Status: DC | PRN
  Administered 2015-07-28 – 2015-07-30 (×2): 15 mL via ORAL
  Filled 2015-07-28 (×5): qty 15

## 2015-07-28 MED ORDER — AMPHETAMINE-DEXTROAMPHETAMINE 10 MG PO TABS
10.0000 mg | ORAL_TABLET | Freq: Two times a day (BID) | ORAL | Status: DC
  Administered 2015-07-28 – 2015-07-31 (×6): 10 mg via ORAL
  Filled 2015-07-28 (×7): qty 1
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
## 2015-07-28 NOTE — Progress Notes (Signed)
Not[facility] by [facility] that patient has appealed her DC. Documents due by noon tomorrow, 10/27. Case Management dept following to provide what's needed. Determination as to whether or not they agree with DC could take 72h.

## 2015-07-28 NOTE — Progress Notes (Signed)
I received call from P.T. As well as SW with concerns for pt's discharge. Pt is min assist transfers and then supervision 100 feet without assistive device with therapy. She is not in need of an intense inpatient rehab admission at this time nor is there a bed available for this pt in next 24 hrs. Recommend SNF or HH at this time. [phone]

## 2015-07-28 NOTE — Clinical Social Work Note (Signed)
Clinical Social Worker continuing to follow patient and family for support and discharge planning needs.  Patient states that she was hopeful to go home, however her [place] is moving from [place] and it would be about a week before she would have 24 hour support in the home.  Patient provided with bed offer [facility], however she is in the process of wanting to appeal her discharge.  CM to provide patient with necessary paperwork to complete appeal process.  CSW spoke with [facility] who states that the bed will remain available to patient if still needed at discharge.  CSW remains available for support and to facilitate patient discharge needs once appropriate.  Macario GoldsJesse Jenille Laszlo, [place] [phone]

## 2015-07-28 NOTE — Progress Notes (Signed)
Met with pt to discuss discharge plans.  PT/OT recommending SNF for dc, and CSW has obtained bed in [place].    Pt states she now wants to go home, but not until her daughter gets moved here from [place], which will take about a week, per pt.  She states she wants to stay here until her daughter arrives.   Explained to pt that MD feels she is medically ready for dc today, and that she could not wait a week to be dc'd.   Pt states she would like to appeal her discharge with Medicare.  Pt given additional copy of IM today; she states that she is unable to see the phone # to call appeal in due to problems with her vision.  Pt's aunt on phone; gave aunt # for [place]--instructed her to call ASAP.  Advised pt that pt's records would be reviewed and if [facility] sides with MD re: dc order, pt will be responsible for any charges from dc date on.  She verbalized understanding of this.  She stated that if appeal did not go in her favor, she would probably go on to SNF until her daughter gets here.    Updated CSW, MD and PA.  Will follow.    Reinaldo Raddle, RN, BSN  Trauma/Neuro ICU Case Manager [phone]

## 2015-07-28 NOTE — Discharge Instructions (Addendum)
Orthopaedic Trauma Service Discharge Instructions   General Discharge Instructions  WEIGHT BEARING STATUS: nonweightbearing Left arm. No lifting with left arm. No axial loading or Right elbow  RANGE OF MOTION/ACTIVITY: ROM as tolerated L wrist, forearm, elbow. Gentle shoulder ROM- pendulums, forward flexion and extension. No active abduction L shoulder, Passive abduction ok  PAIN MEDICATION USE AND EXPECTATIONS  You have likely been given narcotic medications to help control your pain.  After a traumatic event that results in an fracture (broken bone) with or without surgery, it is ok to use narcotic pain medications to help control one's pain.  We understand that everyone responds to pain differently and each individual patient will be evaluated on a regular basis for the continued need for narcotic medications. Ideally, narcotic medication use should last no more than 6-8 weeks (coinciding with fracture healing).   As a patient it is your responsibility as well to monitor narcotic medication use and report the amount and frequency you use these medications when you come to your office visit.   We would also advise that if you are using narcotic medications, you should take a dose prior to therapy to maximize you participation.  IF YOU ARE ON NARCOTIC MEDICATIONS IT IS NOT PERMISSIBLE TO OPERATE A MOTOR VEHICLE (MOTORCYCLE/CAR/TRUCK/MOPED) OR HEAVY MACHINERY DO NOT MIX NARCOTICS WITH OTHER CNS (CENTRAL NERVOUS SYSTEM) DEPRESSANTS SUCH AS ALCOHOL  Diet: as you were eating previously.  Can use over the counter stool softeners and bowel preparations, such as Miralax, to help with bowel movements.  Narcotics can be constipating.  Be sure to drink plenty of fluids  Wound Care:ok to wash wounds with soap and water only. No ointments or lotions. See detailed instructions below   STOP SMOKING OR USING NICOTINE PRODUCTS!!!!  As discussed nicotine severely impairs your body's ability to heal surgical  and traumatic wounds but also impairs bone healing.  Wounds and bone heal by forming microscopic blood vessels (angiogenesis) and nicotine is a vasoconstrictor (essentially, shrinks blood vessels).  Therefore, if vasoconstriction occurs to these microscopic blood vessels they essentially disappear and are unable to deliver necessary nutrients to the healing tissue.  This is one modifiable factor that you can do to dramatically increase your chances of healing your injury.    (This means no smoking, no nicotine gum, patches, etc)  DO NOT USE NONSTEROIDAL ANTI-INFLAMMATORY DRUGS (NSAID'S)  Using products such as Advil (ibuprofen), Aleve (naproxen), Motrin (ibuprofen) for additional pain control during fracture healing can delay and/or prevent the healing response.  If you would like to take over the counter (OTC) medication, Tylenol (acetaminophen) is ok.  However, some narcotic medications that are given for pain control contain acetaminophen as well. Therefore, you should not exceed more than 4000 mg of tylenol in a day if you do not have liver disease.  Also note that there are may OTC medicines, such as cold medicines and allergy medicines that my contain tylenol as well.  If you have any questions about medications and/or interactions please ask your doctor/PA or your pharmacist.      ICE AND ELEVATE INJURED/OPERATIVE EXTREMITY  Using ice and elevating the injured extremity above your heart can help with swelling and pain control.  Icing in a pulsatile fashion, such as 20 minutes on and 20 minutes off, can be followed.    Do not place ice directly on skin. Make sure there is a barrier between to skin and the ice pack.    Using frozen items such as  frozen peas works well as the conform nicely to the are that needs to be iced.  USE AN ACE WRAP OR TED HOSE FOR SWELLING CONTROL  In addition to icing and elevation, Ace wraps or TED hose are used to help limit and resolve swelling.  It is recommended to  use Ace wraps or TED hose until you are informed to stop.    When using Ace Wraps start the wrapping distally (farthest away from the body) and wrap proximally (closer to the body)   Example: If you had surgery on your leg or thing and you do not have a splint on, start the ace wrap at the toes and work your way up to the thigh        If you had surgery on your upper extremity and do not have a splint on, start the ace wrap at your fingers and work your way up to the upper arm  IF YOU ARE IN A SPLINT OR CAST DO NOT REMOVE IT FOR ANY REASON   If your splint gets wet for any reason please contact the office immediately. You may shower in your splint or cast as long as you keep it dry.  This can be done by wrapping in a cast cover or garbage back (or similar)  Do Not stick any thing down your splint or cast such as pencils, money, or hangers to try and scratch yourself with.  If you feel itchy take benadryl as prescribed on the bottle for itching  IF YOU ARE IN A CAM BOOT (BLACK BOOT)  You may remove boot periodically. Perform daily dressing changes as noted below.  Wash the liner of the boot regularly and wear a sock when wearing the boot. It is recommended that you sleep in the boot until told otherwise  CALL THE OFFICE WITH ANY QUESTIONS OR CONCERTS: [phone]     Discharge Pin Site Instructions  Dress pins daily with Kerlix roll starting on POD 2. Wrap the Kerlix so that it tamps the skin down around the pin-skin interface to prevent/limit motion of the skin relative to the pin.  (Pin-skin motion is the primary cause of pain and infection related to external fixator pin sites).  Remove any crust or coagulum that may obstruct drainage with a saline moistened gauze or soap and water.  After POD 3, if there is no discernable drainage on the pin site dressing, the interval for change can by increased to every other day.  You may shower with the fixator, cleaning all pin sites gently with soap  and water.  If you have a surgical wound this needs to be completely dry and without drainage before showering.  The extremity can be lifted by the fixator to facilitate wound care and transfers.  Notify the office/Doctor if you experience increasing drainage, redness, or pain from a pin site, or if you notice purulent (thick, snot-like) drainage.  Discharge Wound Care Instructions  Do NOT apply any ointments, solutions or lotions to pin sites or surgical wounds.  These prevent needed drainage and even though solutions like hydrogen peroxide kill bacteria, they also damage cells lining the pin sites that help fight infection.  Applying lotions or ointments can keep the wounds moist and can cause them to breakdown and open up as well. This can increase the risk for infection. When in doubt call the office.  Surgical incisions should be dressed daily.  If any drainage is noted, use one layer of adaptic, then gauze,  Kerlix, and an ace wrap.  Once the incision is completely dry and without drainage, it may be left open to air out.  Showering may begin 36-48 hours later.  Cleaning gently with soap and water.  Traumatic wounds should be dressed daily as well.    One layer of adaptic, gauze, Kerlix, then ace wrap.  The adaptic can be discontinued once the draining has ceased    If you have a wet to dry dressing: wet the gauze with saline the squeeze as much saline out so the gauze is moist (not soaking wet), place moistened gauze over wound, then place a dry gauze over the moist one, followed by Kerlix wrap, then ace wrap.  ---------------------------------------------------------  Follow up with Dr. Chriss CzarKeith Miller at the schedule appointment.  Your long term pain management will resume with him.    Rib Fracture A rib fracture is a break or crack in one of the bones of the ribs. The ribs are a group of long, curved bones that wrap around your chest and attach to your spine. They protect your lungs  and other organs in the chest cavity. A broken or cracked rib is often painful, but most do not cause other problems. Most rib fractures heal on their own over time. However, rib fractures can be more serious if multiple ribs are broken or if broken ribs move out of place and push against other structures. CAUSES   A direct blow to the chest. For example, this could happen during contact sports, a car accident, or a fall against a hard object.  Repetitive movements with high force, such as pitching a baseball or having severe coughing spells. SYMPTOMS   Pain when you breathe in or cough.  Pain when someone presses on the injured area. DIAGNOSIS  Your caregiver will perform a physical exam. Various imaging tests may be ordered to confirm the diagnosis and to look for related injuries. These tests may include a chest X-ray, computed tomography (CT), magnetic resonance imaging (MRI), or a bone scan. TREATMENT  Rib fractures usually heal on their own in 1-3 months. The longer healing period is often associated with a continued cough or other aggravating activities. During the healing period, pain control is very important. Medication is usually given to control pain. Hospitalization or surgery may be needed for more severe injuries, such as those in which multiple ribs are broken or the ribs have moved out of place.  HOME CARE INSTRUCTIONS   Avoid strenuous activity and any activities or movements that cause pain. Be careful during activities and avoid bumping the injured rib.  Gradually increase activity as directed by your caregiver.  Only take over-the-counter or prescription medications as directed by your caregiver. Do not take other medications without asking your caregiver first.  Apply ice to the injured area for the first 1-2 days after you have been treated or as directed by your caregiver. Applying ice helps to reduce inflammation and pain.  Put ice in a plastic bag.  Place a towel  between your skin and the bag.   Leave the ice on for 15-20 minutes at a time, every 2 hours while you are awake.  Perform deep breathing as directed by your caregiver. This will help prevent pneumonia, which is a common complication of a broken rib. Your caregiver may instruct you to:  Take deep breaths several times a day.  Try to cough several times a day, holding a pillow against the injured area.  Use a  device called an incentive spirometer to practice deep breathing several times a day.  Drink enough fluids to keep your urine clear or pale yellow. This will help you avoid constipation.   Do not wear a rib belt or binder. These restrict breathing, which can lead to pneumonia.  SEEK IMMEDIATE MEDICAL CARE IF:   You have a fever.   You have difficulty breathing or shortness of breath.   You develop a continual cough, or you cough up thick or bloody sputum.  You feel sick to your stomach (nausea), throw up (vomit), or have abdominal pain.   You have worsening pain not controlled with medications.  MAKE SURE YOU:  Understand these instructions.  Will watch your condition.  Will get help right away if you are not doing well or get worse.   This information is not intended to replace advice given to you by your health care provider. Make sure you discuss any questions you have with your health care provider.   Document Released: 09/18/2005 Document Revised: 05/21/2013 Document Reviewed: 11/20/2012 Elsevier Interactive Patient Education [employer]
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
## 2015-07-28 NOTE — Progress Notes (Signed)
CM spoke with patient and spoke to patient at the bedside. Cm provided the patient with the detailed notice of discharge and the HINN-12 notice. CM explained these papers at length and explained that Medicare does not cover inpatient hospital services that are not medically necessary or that could be provided in another setting safely and if that is the case, she would be responsible for the amount not covered. Understanding verbalized by patient. Cm explained that she had a SNF bed but she said that she did not want to go to the SNF provided to her because she did not feel she would receive competent care there after she researched it. Pt said that she would like to speak with her Aunt Alyson LocketDonnette Miller further about the situation and see what can be worked out. Pt said she would prefer to go home with home health than to go to the SNF assigned to her and CM explained that she would have to have 24 hour supervision and pt said she may be able to work that out but would not know until speaking with her Aunt. Pt tried calling her aunt and left a VM.   CM reviewed the detailed notice of discharge and the HINN-12 provided to the patient using teach back method and patient verbalized understanding.  Patient declined to sign the papers at this time despite saying she understood what they meant. CM left copies of both notice letters at the bedside with the patient at which she put in her personal notebook. Pt said that she was tired from taking her pain medication and said that she would speak with her aunt in the morning and decide what she wants to do: SNF vs [facility] and 24/7 supervision. CM advised of various HH agencies and pt said again that she would not be able to make the decision without her Aunt, Donnette. Donnette Hyacinth MeekerMiller can be reached @ [phone]. Cm will continue to follow the patient for discharge planning needs.
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
## 2015-07-28 NOTE — Care Management Important Message (Deleted)
Important Message  Patient Details  Name: Lori Dickerson MRN: [id number] Date of Birth: 08/08/1971   Medicare Important Message Given:       Glennon Macmerson, Laurey Salser M, RN 07/28/2015, 3:58 PM

## 2015-07-28 NOTE — Care Management Important Message (Signed)
Important Message  Patient Details  Name: Lori Dickerson MRN: [id number] Date of Birth: 07/28/1971   Medicare Important Message Given:  Yes-second notification given    Glennon Macmerson, Avamae Dehaan M, RN 07/28/2015, 4:01 PM
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
## 2015-07-29 MED ORDER — OXYCODONE HCL 5 MG PO TABS
10.0000 mg | ORAL_TABLET | ORAL | Status: DC
  Administered 2015-07-29 – 2015-07-31 (×13): 20 mg via ORAL
  Filled 2015-07-29 (×13): qty 4

## 2015-07-29 MED ORDER — TRAZODONE HCL 50 MG PO TABS
50.0000 mg | ORAL_TABLET | Freq: Every evening | ORAL | Status: DC | PRN
  Administered 2015-07-30 (×2): 50 mg via ORAL
  Filled 2015-07-29 (×2): qty 1
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
## 2015-07-29 NOTE — Progress Notes (Signed)
Per Rn pt called RN and Tech answered call light and went to assess the needs and pt was found one hip on edge of bed and other foot in puddle of urine on floor. Pt was assessed  RN in the front and tech was at rear placed in bed. RN gave 2mg  xanax po for anxiety. Pt was reassessed and she stated that she has right hip pain. No bruising  On the hip. Bed alarm placed and pt was informed that she is not to get out of bed without assistance. Will continue to monitor. Ilean SkillVeronica Cherisa Brucker LPN
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
## 2015-07-30 NOTE — Progress Notes (Signed)
   07/30/15 1416  Clinical Encounter Type  Visited With Patient;Health care provider  Visit Type Follow-up  Spiritual Encounters  Spiritual Needs Emotional;Grief support  Stress Factors  Patient Stress Factors Loss   Chaplain met with the patient to offer follow-up care. Patient complained of feeling lied to and inconsistent messages from different people. Patient again complained about issues with vision and indicated that her migraines have grown in intensity as well. Patient is worried about being discharged to a facility that is far away from her home and family. Chaplain offered support, and our support is available as needed.   Jeri Lager, Chaplain 07/30/2015 2:19 PM.

## 2015-07-30 NOTE — Progress Notes (Signed)
I received call from pt's Aunt to discuss my assessment of Lori Dickerson's needs for rehab. I have reviewed with her that pt not a candidate to be admitted to our inpatient rehab center for overall no longer needs hospital level of care, and her PT and OT needs could be met at home with Home health follow up if 24/7 supervision could be provided at home. If supervision not available at home, that SNF level rehab is recommended until pt reached level of independence or 24/7 supervision at home could be provided. Aunt requested that I provide her a written statement of my assessment. I recommended that she get a release of information from the medical record department for documentation of my assessment and that I would not provide a written documentation. I also noted that I was on a conference call and not made aware prior to conversing. Lori Dickerson, Lori Dickerson and Luke PA involved in conference call. I have discussed Pt's case with Dr. Hulda Humphrey and he concurs with my assessment and recommendations. When questioned by Aunt, Provided her Rehab MD involved name, Dr. Posey Pronto. Please call me with any questions. [phone]
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
## 2015-07-30 NOTE — Care Management Note (Signed)
Case Management Note  Patient Details  Name: Lori Dickerson MRN: [id number] Date of Birth: 08/04/1971  Subjective/Objective:                    Action/Plan:  Spoke to patient and Aunt at bedside . No decision yet on appeal .   Patient was considering go to stay with a friend [name]( [name] ) at discharge , however, patient's Aunt does not feel that "that is a good idea" .   Patient considering going home with home health states her daughter can help "some" patient aware PT recommending assistance at home . Aunt travels , she is unable to provide assistance at home . Provided private duty sitter list . [name] states they can't afford private care . Aunt and patient will reconsider SNF . Continue to follow . Expected Discharge Date:  07/22/15               Expected Discharge Plan:  Skilled Nursing Facility  In-House Referral:  Clinical Social Work  Discharge planning Services  CM Consult  Post Acute Care Choice:    Choice offered to:     DME Arranged:    DME Agency:     HH Arranged:    HH Agency:     Status of Service:  In process, will continue to follow  Medicare Important Message Given:  Yes-second notification given Date Medicare IM Given:    Medicare IM give by:    Date Additional Medicare IM Given:    Additional Medicare Important Message give by:     If discussed at Long Length of Stay Meetings, dates discussed:    Additional Comments:  Kingsley PlanWile, Denesha Brouse Marie, RN 07/30/2015, 12:20 PM
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
## 2015-07-30 NOTE — Clinical Social Work Note (Signed)
CSW spoke with [facility] [place], which remains the patient's only SNF option at this time. Facility states they are able to accommodate a weekend admission for the patient if the patient chooses to come to their facility. Patient and family are aware that this is the only SNF option available to the patient at this time. Weekend CSW will need to call admissions coordinator Debbie at [phone] to facilitate the DC. Eunice BlaseDebbie states she needs to have the DC as soon as possible on day of DC. Report left for weekend CSW.   Roddie McBryant Cesar Rogerson MSW, [place]CS[place], [place], [phone]
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
## 2015-07-30 NOTE — Progress Notes (Signed)
Per Aunt - Appeal denied and filed a re-appeal. Lori Dickerson has stated she [patient] will accept the SN[place] placement tomorrow [place] and wants her to be transferred by ambulance before noon tomorrow, Saturday, 10/29. Contact information for her aunt: Alyson LocketDonnette Miller [phone].
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
## 2015-07-31 MED ORDER — OXYCODONE HCL 20 MG PO TABS: 10.0000 mg | ORAL_TABLET | ORAL | Status: AC | PRN
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
## 2015-07-31 NOTE — Clinical Social Work Note (Signed)
Patient to be d/c'ed today to [facility].  Patient and family agreeable to plans will transport via ems RN to call report.  Patient's aunt Wendi SnipesDonntte was notified and she was to notify patient.  Windell MouldingEric Delmo Matty, MSW, Theresia MajorsLCSWA [phone]

## 2015-08-19 ENCOUNTER — Encounter: Payer: Self-pay | Admitting: Internal Medicine

## 2016-06-30 ENCOUNTER — Other Ambulatory Visit: Payer: Self-pay | Admitting: Orthopedic Surgery

## 2016-06-30 DIAGNOSIS — S42342K Displaced spiral fracture of shaft of humerus, left arm, subsequent encounter for fracture with nonunion: Secondary | ICD-10-CM

## 2016-06-30 DIAGNOSIS — S2242XK Multiple fractures of ribs, left side, subsequent encounter for fracture with nonunion: Secondary | ICD-10-CM

## 2016-07-03 ENCOUNTER — Other Ambulatory Visit: Payer: Medicare Other

## 2016-07-11 ENCOUNTER — Ambulatory Visit
Admission: RE | Admit: 2016-07-11 | Discharge: 2016-07-11 | Disposition: A | Discharge status: Home / Self Care | Payer: Medicare Other | Source: Ambulatory Visit | Attending: Orthopedic Surgery | Admitting: Orthopedic Surgery

## 2016-07-11 DIAGNOSIS — S2242XK Multiple fractures of ribs, left side, subsequent encounter for fracture with nonunion: Secondary | ICD-10-CM

## 2016-07-11 DIAGNOSIS — S42342K Displaced spiral fracture of shaft of humerus, left arm, subsequent encounter for fracture with nonunion: Secondary | ICD-10-CM
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
## 2016-10-14 IMAGING — CT CT CHEST W/O CM
1 of 2 series · 15 of 32 positions shown, 19 images · non-contrast
Comparison: None.

CLINICAL DATA: Multiple left rib fractures with nonunion.

EXAM:
CT CHEST WITHOUT CONTRAST
TECHNIQUE: Multidetector CT imaging of the chest was performed following the
standard protocol without IV contrast.

[Series 2: chest w/(date) · axial · 0.94mm/px · z∈[-669,-367]mm · 15 of 175 slices shown, 19 images]
[im 12/175  mediastinal]
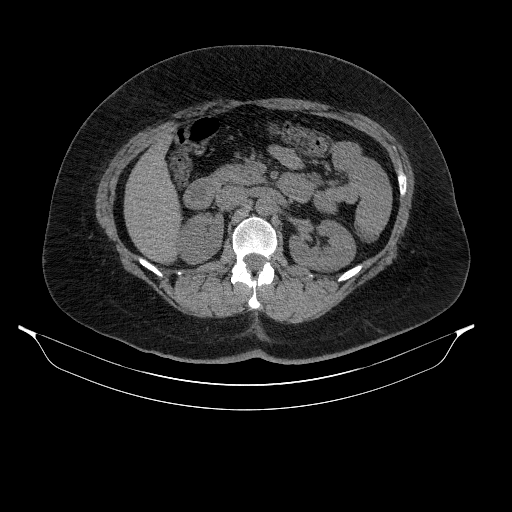
[im 12/175  lung]
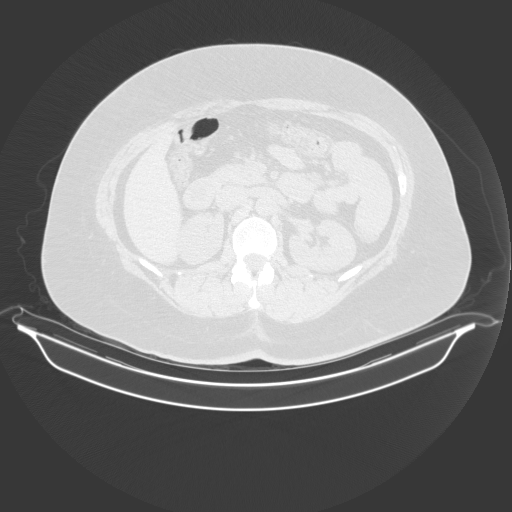
[im 24/175  lung]
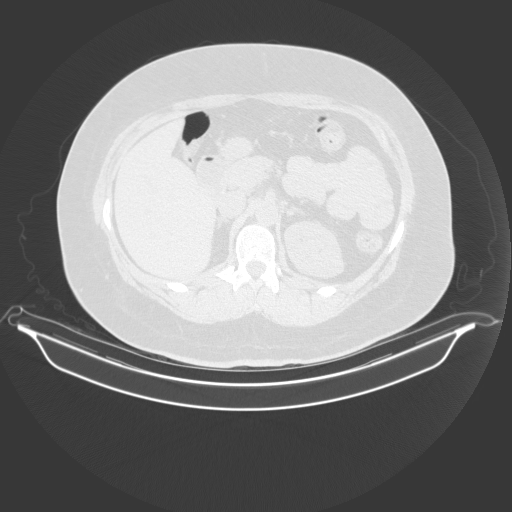
[im 35/175  lung]
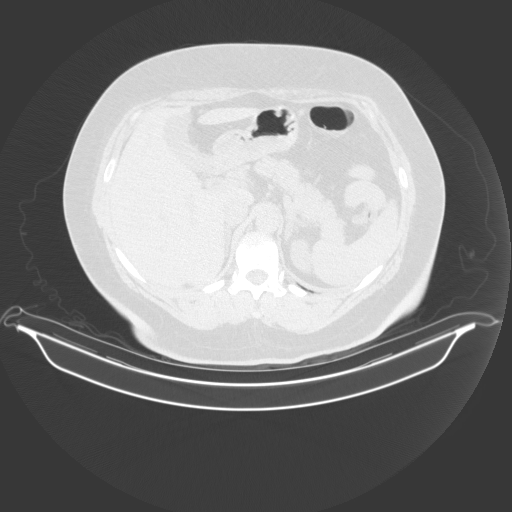
[im 47/175  lung]
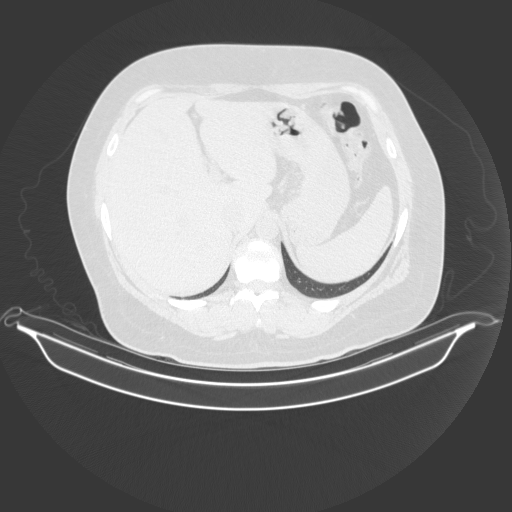
[im 59/175  mediastinal]
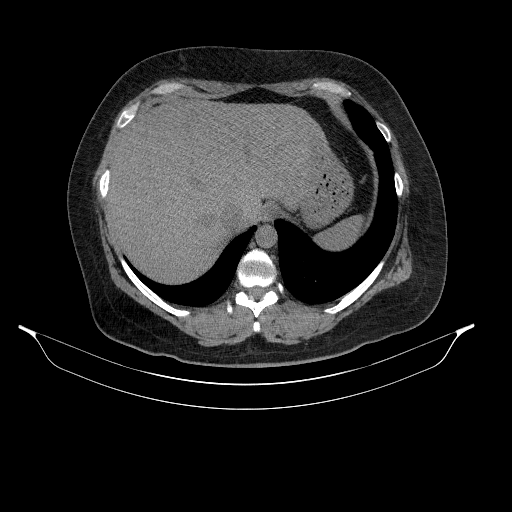
[im 59/175  lung]
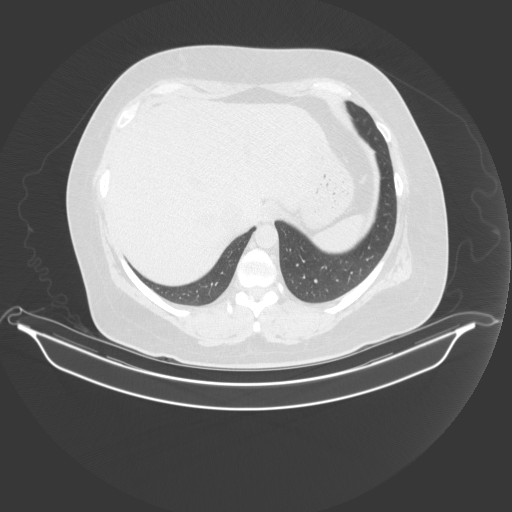
[im 70/175  lung]
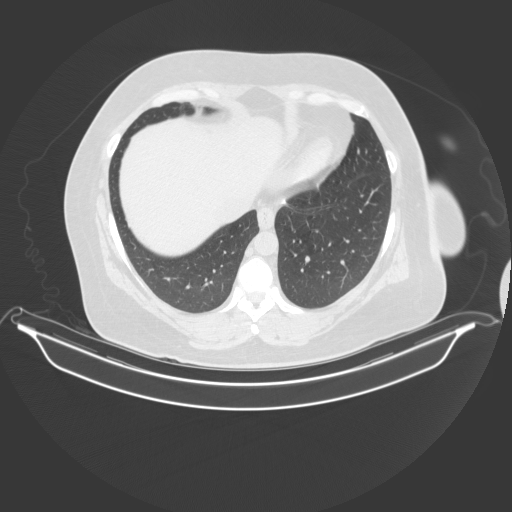
[im 82/175  lung]
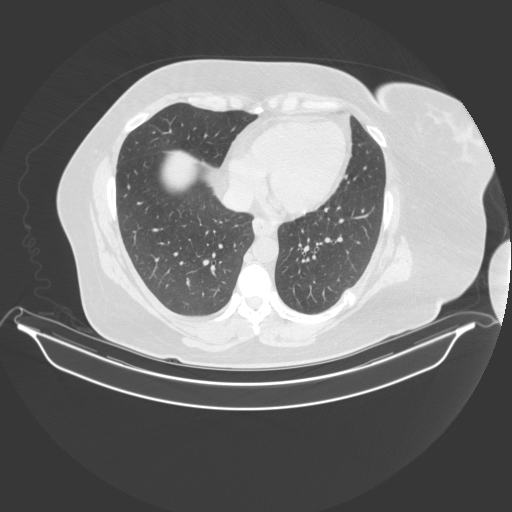
[im 88/175  lung]
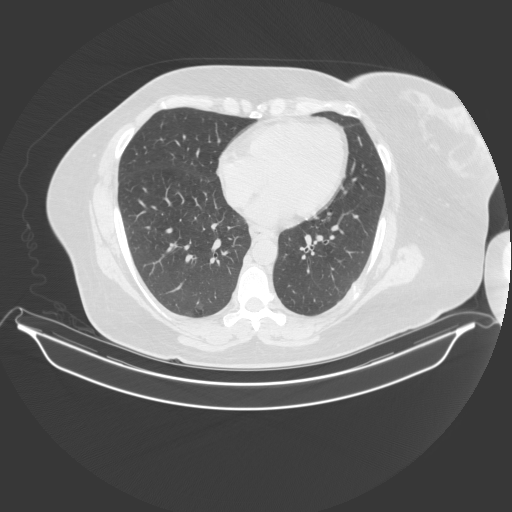
[im 93/175  mediastinal]
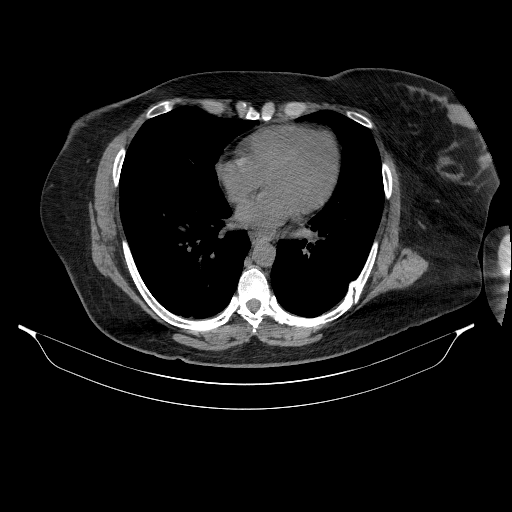
[im 93/175  lung]
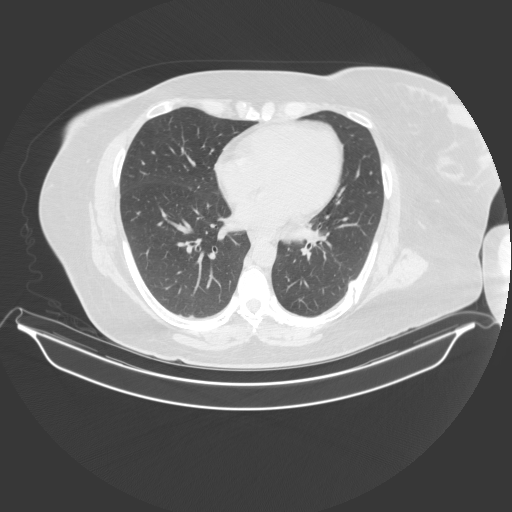
[im 105/175  lung]
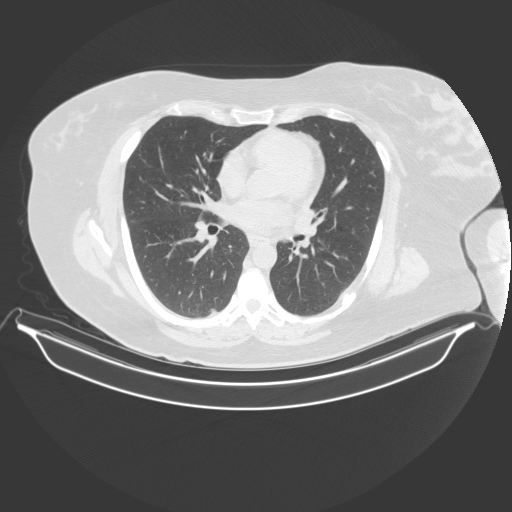
[im 117/175  lung]
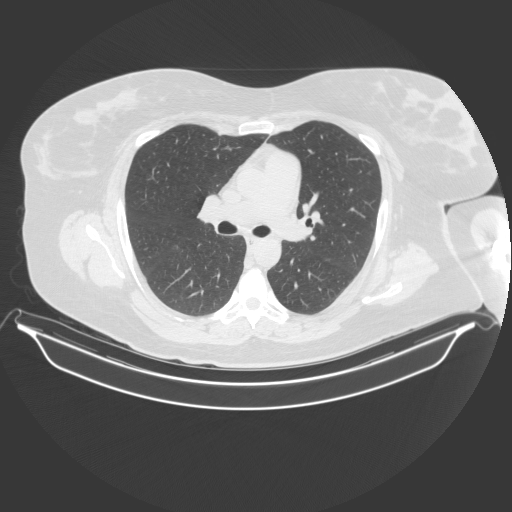
[im 128/175  lung]
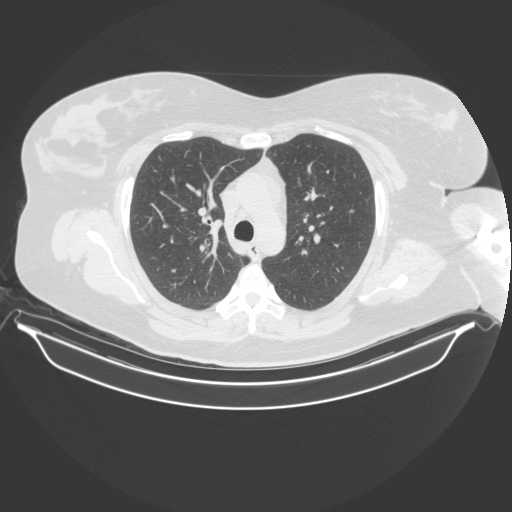
[im 140/175  mediastinal]
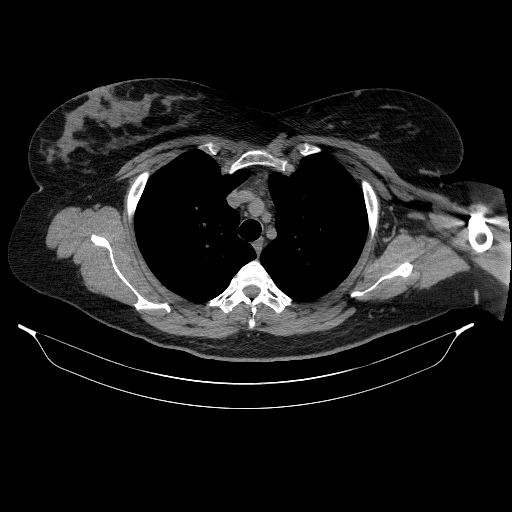
[im 140/175  lung]
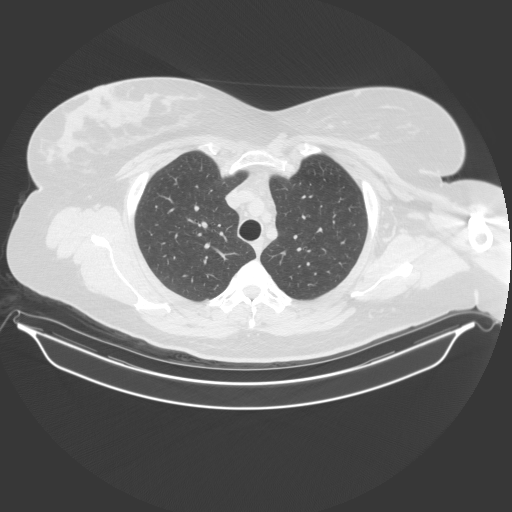
[im 151/175  lung]
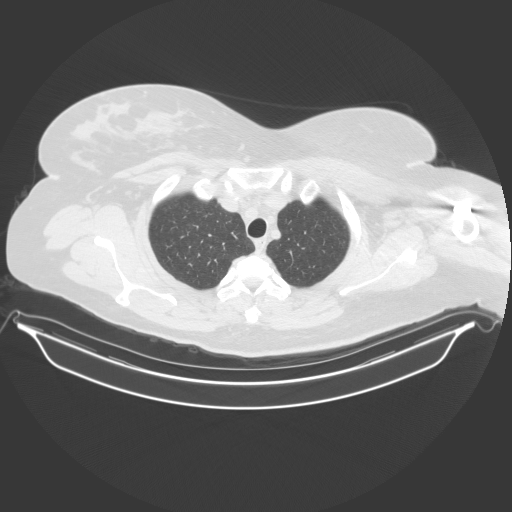
[im 163/175  lung]
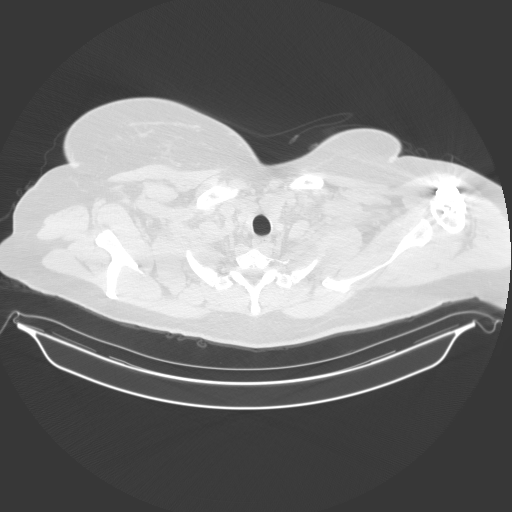

[15 of 32 positions shown; findings below may reference images not displayed]

FINDINGS: Cardiovascular: Heart is normal size. Aorta is normal caliber.

Mediastinum/Nodes: No mediastinal, hilar, or axillary adenopathy.

Lungs/Pleura: Lungs are clear. No focal airspace opacities or
suspicious nodules. No effusions.

Upper Abdomen: Imaging into the upper abdomen shows no acute
findings. Fat density structure noted posteriorly in the liver
measures up to 2.7 cm compatible with lipoma.

Musculoskeletal: Chest wall soft tissues are unremarkable. Old
healed posterior left rib fractures involving the fifth and sixth
ribs. Nonunion of old fractures involving the seventh and eighth
left ribs posteriorly. No visible acute fracture.
IMPRESSION: Old posterior left rib fractures involving fifth through eighth ribs
with nonunion at the seventh and eighth ribs.

No acute cardiopulmonary disease.

## 2017-08-22 ENCOUNTER — Other Ambulatory Visit: Payer: Self-pay | Admitting: Orthopedic Surgery

## 2017-08-22 DIAGNOSIS — R531 Weakness: Secondary | ICD-10-CM

## 2017-09-06 ENCOUNTER — Ambulatory Visit
Admission: RE | Admit: 2017-09-06 | Discharge: 2017-09-06 | Disposition: A | Discharge status: Home / Self Care | Payer: Medicare Other | Source: Ambulatory Visit | Attending: Orthopedic Surgery | Admitting: Orthopedic Surgery

## 2017-09-06 DIAGNOSIS — R531 Weakness: Secondary | ICD-10-CM

## 2017-10-25 ENCOUNTER — Other Ambulatory Visit: Payer: Self-pay | Admitting: Orthopedic Surgery

## 2017-10-30 NOTE — Progress Notes (Signed)
Called patient states she is sick(cough, fever, congestion) and that she had called Dr. Marshell Levanumonski's office. I asked patient that even if Dr. Yevette Edwardsumonski was ok going forward with surgery would she feel comfortable having surgery tomorrow. Patient stated she did not want to have surgery until she was better. Notified Dr. Marshell Levanumonski's surgery scheduler of same states she will notify Dr. Yevette Edwardsumonski.

## 2017-10-31 ENCOUNTER — Encounter (HOSPITAL_COMMUNITY): Admission: RE | Payer: Self-pay | Source: Ambulatory Visit

## 2017-10-31 ENCOUNTER — Ambulatory Visit (HOSPITAL_COMMUNITY): Admission: RE | Admit: 2017-10-31 | Payer: Medicare Other | Source: Ambulatory Visit | Admitting: Orthopedic Surgery

## 2017-10-31 SURGERY — ANTERIOR CERVICAL DECOMPRESSION/DISCECTOMY FUSION 1 LEVEL
Anesthesia: General

## 2017-12-04 ENCOUNTER — Other Ambulatory Visit: Payer: Self-pay | Admitting: Orthopedic Surgery

## 2017-12-07 ENCOUNTER — Encounter (HOSPITAL_COMMUNITY): Payer: Self-pay

## 2017-12-10 ENCOUNTER — Encounter (HOSPITAL_COMMUNITY): Payer: Self-pay

## 2017-12-10 ENCOUNTER — Encounter (HOSPITAL_COMMUNITY): Payer: Self-pay | Admitting: Emergency Medicine

## 2017-12-10 ENCOUNTER — Inpatient Hospital Stay (HOSPITAL_COMMUNITY)
Admission: RE | Admit: 2017-12-10 | Discharge: 2017-12-10 | Disposition: A | Discharge status: Home / Self Care | Payer: Medicare Other | Source: Ambulatory Visit

## 2017-12-10 ENCOUNTER — Encounter (HOSPITAL_COMMUNITY): Payer: Self-pay | Admitting: Urology

## 2017-12-10 NOTE — Progress Notes (Signed)
Pt did not show for pre-op appointment. Phone interview completed with medical history. Medicines not updated in chart. Pt verbally told me which medicines she takes in the morning and states she will bring a list DOS.   Pt reports having intermittent chest pain that radiates to her shoulder. Usually when she is around her daughters. Pt states she takes an Aspirin and Xanax and this pain goes away. Pt denies having a cardiologist but history of Angina "years" ago but no cath performed. Pt told to call PCP about this chest pain and to seek medical attention if this pain persists.   Chart forwarded to anesthesia for review and Lupita Leash at Dr. Marshell Levan office notified.   Pt sees Dr. Hurley Cisco in [place] as PCP.

## 2017-12-10 NOTE — Progress Notes (Signed)
Anesthesia Chart Review:  Pt is a same day work up.  She was unable to attend her pre-admission testing appointment  During phone call with pre-admission testing RN, pt reported several episodes of chest pain radiating to L shoulder, usually when she is around her daughters; she takes ASA and xanax and "the pain goes away"  - [place]  Pt is a 47 year old female scheduled for C5-6 ACDF on 12/13/2017 with Estill BambergMark Dumonski, MD  PMH includes:  COPD, DVT, seizure disorder, lyme disease, GERD. Current smoker. BMI 34.5. S/p ORIF proximal humerus fx 07/19/15  Medications include: Albuterol, Adderall, Nexium, Pepcid, simvastatin  Labs will be obtained day of surgery  Echo 08/19/15 (care everywhere):  - Normal left ventricular size and systolic function with no appreciable segmental abnormality. - Ejection fraction is visually estimated at 60-65% - Normal right ventricle structure and function. - Large left Pleural effusion noted. - No significant valvular abnormalities noted  Reviewed case with Dr. Malen GauzeFoster.  Pt will need to have chest pain sx evaluated by PCP prior to surgery. I left voicemail for Lupita LeashDonna in Dr. Marshell Levanumonski's office.   Rica Mastngela Tyshawna Alarid, FNP-BC [facility]/Anesthesiology Phone: [phone] 12/10/2017 4:52 PM

## 2017-12-12 MED ORDER — CEFAZOLIN SODIUM-DEXTROSE 2-4 GM/100ML-% IV SOLN: 2.0000 g | INTRAVENOUS | Status: AC

## 2017-12-12 NOTE — Progress Notes (Signed)
Patient called and stated that she had to go the emergency room to be seen and then would be following up with her PCP.  Patient stated that her PCP would not clear her for surgery until after she had been seen.

## 2017-12-12 NOTE — Progress Notes (Signed)
Spoke with Lupita Leashonna at Dr Dumonski's office asked her if she has heard from the patient about following up about her chest pain. Lupita LeashDonna states that she just got the message today. Lupita LeashDonna will call the patient to see if she can get into her PCP today. Message sent to Dr Yevette Edwardsumonski to inform him of above.

## 2017-12-13 ENCOUNTER — Ambulatory Visit (HOSPITAL_COMMUNITY): Admission: RE | Admit: 2017-12-13 | Payer: Medicare Other | Source: Ambulatory Visit | Admitting: Orthopedic Surgery

## 2017-12-13 HISTORY — DX: Angina pectoris, unspecified: I20.9

## 2017-12-13 HISTORY — DX: Gastro-esophageal reflux disease without esophagitis: K21.9

## 2017-12-13 HISTORY — DX: Personal history of other infectious and parasitic diseases: Z86.19

## 2017-12-13 HISTORY — DX: Other specified postprocedural states: R11.2

## 2017-12-13 HISTORY — DX: Other specified postprocedural states: Z98.890

## 2017-12-13 SURGERY — ANTERIOR CERVICAL DECOMPRESSION/DISCECTOMY FUSION 1 LEVEL
Anesthesia: General | Laterality: Bilateral

## 2023-03-03 ENCOUNTER — Encounter (HOSPITAL_BASED_OUTPATIENT_CLINIC_OR_DEPARTMENT_OTHER): Payer: Self-pay | Admitting: Emergency Medicine

## 2023-03-03 ENCOUNTER — Emergency Department (HOSPITAL_BASED_OUTPATIENT_CLINIC_OR_DEPARTMENT_OTHER)
Admission: EM | Admit: 2023-03-03 | Discharge: 2023-03-03 | Disposition: A | Discharge status: Home / Self Care | Payer: 59 | Attending: Emergency Medicine | Admitting: Emergency Medicine

## 2023-03-03 ENCOUNTER — Emergency Department (HOSPITAL_BASED_OUTPATIENT_CLINIC_OR_DEPARTMENT_OTHER): Payer: 59

## 2023-03-03 ENCOUNTER — Telehealth (HOSPITAL_BASED_OUTPATIENT_CLINIC_OR_DEPARTMENT_OTHER): Payer: Self-pay

## 2023-03-03 ENCOUNTER — Other Ambulatory Visit: Payer: Self-pay

## 2023-03-03 DIAGNOSIS — S93402A Sprain of unspecified ligament of left ankle, initial encounter: Secondary | ICD-10-CM | POA: Insufficient documentation

## 2023-03-03 DIAGNOSIS — W19XXXA Unspecified fall, initial encounter: Secondary | ICD-10-CM | POA: Insufficient documentation

## 2023-03-03 DIAGNOSIS — S8991XA Unspecified injury of right lower leg, initial encounter: Secondary | ICD-10-CM

## 2023-03-03 DIAGNOSIS — Z79899 Other long term (current) drug therapy: Secondary | ICD-10-CM | POA: Insufficient documentation

## 2023-03-03 DIAGNOSIS — F0390 Unspecified dementia without behavioral disturbance: Secondary | ICD-10-CM | POA: Insufficient documentation

## 2023-03-03 DIAGNOSIS — S80911A Unspecified superficial injury of right knee, initial encounter: Secondary | ICD-10-CM | POA: Insufficient documentation

## 2023-03-03 DIAGNOSIS — Z7951 Long term (current) use of inhaled steroids: Secondary | ICD-10-CM | POA: Diagnosis not present

## 2023-03-03 DIAGNOSIS — F17210 Nicotine dependence, cigarettes, uncomplicated: Secondary | ICD-10-CM | POA: Diagnosis not present

## 2023-03-03 DIAGNOSIS — J449 Chronic obstructive pulmonary disease, unspecified: Secondary | ICD-10-CM | POA: Diagnosis not present

## 2023-03-03 DIAGNOSIS — Z7901 Long term (current) use of anticoagulants: Secondary | ICD-10-CM | POA: Diagnosis not present

## 2023-03-03 DIAGNOSIS — S99912A Unspecified injury of left ankle, initial encounter: Secondary | ICD-10-CM | POA: Diagnosis present

## 2023-03-03 DIAGNOSIS — Z96653 Presence of artificial knee joint, bilateral: Secondary | ICD-10-CM | POA: Insufficient documentation

## 2023-03-03 MED ORDER — OXYCODONE HCL 5 MG PO TABS
20.0000 mg | ORAL_TABLET | Freq: Once | ORAL | Status: AC
  Administered 2023-03-03: 20 mg via ORAL
  Filled 2023-03-03: qty 4

## 2023-03-03 NOTE — ED Provider Notes (Signed)
[facility] Provider Note: Lowella Dell, MD, FACEP  CSN: [id number] MRN: [id number] ARRIVAL: 03/03/23 at 0230 ROOM: [facility]   CHIEF COMPLAINT  Fall   HISTORY OF PRESENT ILLNESS  03/03/23 3:49 AM Lori Dickerson is a 52 y.o. female who fell about 4 days ago and injured her left ankle.  She also injured her right knee but not significantly.  About 2 days ago she felt a pop in her right knee and has had the gradual onset of severe (10 out of 10) pain in her right knee with associated swelling.  The pain is worse with dorsiflexion of her right foot and the pain radiates into her thigh.  She has had bilateral knee replacements.  She is on Xarelto for recurrent thromboembolic disease.  She is on chronic pain management and receives 150, 20 mg oxycodone tablets monthly.   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   DJD (degenerative joint disease)    DVT (deep venous thrombosis) (HCC)    history of multiple DVTs   Equilibrium disorder    Fibromyalgia    Fracture of fifth metacarpal bone of left hand 07/20/2015   Fracture of radial head, right, closed 07/20/2015   GERD (gastroesophageal reflux disease)    History of sepsis    Staph bacteria after knee surgery   Hypotension    Lyme disease    Meningitis spinal    Neuropathy    Nicotine dependence 07/20/2015   PONV (postoperative nausea and vomiting)    Seizure disorder (HCC)     Past Surgical History:  Procedure Laterality Date   BREAST SURGERY     cyst removal breast   COLONOSCOPY W/ POLYPECTOMY     EYE SURGERY     fluid drained from lung      KNEE SURGERY     ORIF HUMERUS FRACTURE Left 07/19/2015   Procedure: OPEN REDUCTION INTERNAL FIXATION (ORIF) PROXIMAL HUMERUS FRACTURE;  Surgeon: Myrene Galas, MD;  Location: [facility] OR;  Service: Orthopedics;  Laterality: Left;   PARTIAL HYSTERECTOMY     TOTAL KNEE ARTHROPLASTY Bilateral    TUBAL LIGATION  2004    Family History  Problem Relation Age of Onset   Emphysema Maternal Grandfather        smoker   Heart disease Maternal Grandfather    Bladder Cancer Maternal Grandfather    Allergies Unknown        everyone   Asthma Son    Clotting disorder Maternal Aunt     Social History   Tobacco Use   Smoking status: Every Day    Packs/day: 1.00    Years: 23.00    Additional pack years: 0.00    Total pack years: 23.00    Types: Cigarettes   Smokeless tobacco: Never  Substance Use Topics   Alcohol use: Yes    Comment: 1-2 drinkg per month   Drug use: No    Prior to Admission medications   Medication Sig Start Date End Date Taking? Authorizing Provider  acetaminophen (TYLENOL) 325  MG tablet Take 2 tablets (650 mg total) by mouth every 6 (six) hours as needed (Temperature 101.5 or greater). 07/28/15   Nonie Hoyer, PA-C  albuterol (PROAIR HFA) 108 (90 BASE) MCG/ACT inhaler Inhale 2 puffs into the lungs every 6 (six) hours as needed for wheezing.    [provider]  albuterol (PROVENTIL HFA;VENTOLIN HFA) 108 (90 BASE) MCG/ACT inhaler Inhale 1-2 puffs into the lungs every 6 (six) hours as needed for wheezing or shortness of breath.    [provider]  ALPRAZolam Prudy Feeler) 1 MG tablet Take 1 mg by mouth 4 (four) times daily.    [provider]  ALPRAZolam Prudy Feeler) 1 MG tablet Take 2 mg by mouth 2 (two) times daily as needed for anxiety.    [provider]  ammonium lactate (AMLACTIN) 12 % cream Apply 1 g topically 2 (two) times daily.    [provider]  amphetamine-dextroamphetamine (ADDERALL XR) 10 MG 24 hr capsule Take 10 mg by mouth 4  (four) times daily.    [provider]  amphetamine-dextroamphetamine (ADDERALL) 10 MG tablet Take 5-10 mg by mouth 4 (four) times daily.    [provider]  bacitracin ointment Apply topically 2 (two) times daily. 07/28/15   Nonie Hoyer, PA-C  benzonatate (TESSALON) 100 MG capsule Take 200 mg by mouth 2 (two) times daily as needed for cough.    [provider]  benzonatate (TESSALON) 200 MG capsule Take 400 mg by mouth 2 (two) times daily.    [provider]  butalbital-acetaminophen-caffeine (FIORICET WITH CODEINE) 50-325-40-30 MG per capsule Take 1 capsule by mouth every 4 (four) hours as needed for headache.    [provider]  butalbital-acetaminophen-caffeine (FIORICET, ESGIC) 50-325-40 MG tablet Take 1 tablet by mouth every 6 (six) hours as needed (pain).    [provider]  citalopram (CELEXA) 20 MG tablet Take 20 mg by mouth daily.    [provider]  citalopram (CELEXA) 20 MG tablet Take 20 mg by mouth daily.    [provider]  clobetasol cream (TEMOVATE) 0.05 % Apply 1 application topically as needed.    [provider]  clobetasol cream (TEMOVATE) 0.05 % Apply 1 application topically 2 (two) times daily.    [provider]  cyclobenzaprine (FLEXERIL) 10 MG tablet Take 10 mg by mouth 3 (three) times daily as needed for muscle spasms.    [provider]  cyclobenzaprine (FLEXERIL) 10 MG tablet Take 10 mg by mouth 3 (three) times daily as needed for muscle spasms.    [provider]  diclofenac sodium (VOLTAREN) 1 % GEL Apply 4 g topically 3 (three) times daily.    [provider]  docusate sodium (COLACE) 100 MG capsule Take 1 capsule (100 mg total) by mouth 2 (two) times daily as needed for mild constipation. 07/28/15   Nonie Hoyer, PA-C  donepezil (ARICEPT) 10 MG tablet Take 10 mg by mouth 2 (two) times daily.    [provider]  donepezil (ARICEPT) 10 MG  tablet Take 10 mg by mouth 2 (two) times daily.    [provider]  ergocalciferol (VITAMIN D2) 50000 UNITS capsule Take 50,000 Units by mouth once a week.    [provider]  esomeprazole (NEXIUM) 40 MG capsule Take 40 mg by mouth 2 (two) times daily.    [provider]  esomeprazole (NEXIUM) 40 MG capsule Take 40 mg by mouth 2 (two) times daily before a meal.  [provider]  famotidine (PEPCID) 40 MG tablet Take 40 mg by mouth daily.    [provider]  famotidine (PEPCID) 40 MG tablet Take 40 mg by mouth at bedtime.    [provider]  fluconazole (DIFLUCAN) 150 MG tablet Take 150 mg by mouth once. Per month    [provider]  gabapentin (NEURONTIN) 600 MG tablet Take 600 mg by mouth 4 (four) times daily.    [provider]  gabapentin (NEURONTIN) 600 MG tablet Take 600 mg by mouth 4 (four) times daily.    [provider]  ibuprofen (ADVIL,MOTRIN) 800 MG tablet Take 800 mg by mouth every 8 (eight) hours as needed (arthritis pain).    [provider]  imiquimod (ALDARA) 5 % cream Apply 1 application topically 3 (three) times a week.    [provider]  levETIRAcetam (KEPPRA) 750 MG tablet Take 1,500-2,250 mg by mouth 2 (two) times daily. 1500mg  in the morning, 2250mg  in the evening    [provider]  lidocaine (LIDODERM) 5 % Place 1 patch onto the skin as needed. Remove & Discard patch within 12 hours or as directed by MD    [provider]  magnesium oxide (MAG-OX) 400 MG tablet Take 400 mg by mouth daily.    [provider]  meclizine (ANTIVERT) 25 MG tablet Take 25 mg by mouth 3 (three) times daily as needed for dizziness or nausea.    [provider]  meclizine (ANTIVERT) 25 MG tablet Take 25 mg by mouth 3 (three) times daily as needed for dizziness.    [provider]  metoCLOPramide (REGLAN) 5 MG tablet Take 5 mg by mouth daily.    [provider]  metroNIDAZOLE (FLAGYL) 500 MG tablet Take 500 mg by mouth daily. 3-4 times per wk    [provider]  metroNIDAZOLE (METROGEL) 0.75 % gel Apply 1 application topically 2 (two) times daily.    [provider]  montelukast (SINGULAIR) 10 MG tablet Take 10 mg by mouth at bedtime.    [provider]  ondansetron (ZOFRAN) 4 MG tablet Take 4 mg by mouth every 6 (six) hours.    [provider]  oxyCODONE (OXYCONTIN) 20 MG 12 hr tablet Take 20 mg by mouth 4 (four) times daily.    [provider]  OxyCODONE (OXYCONTIN) 80 mg T12A 12 hr tablet Take 1 tablet (80 mg total) by mouth every 12 (twelve) hours. 07/28/15   Nonie Hoyer, PA-C  Oxycodone HCl 20 MG TABS Take 0.5-1 tablets (10-20 mg total) by mouth every 4 (four) hours as needed. 07/31/15   Nonie Hoyer, PA-C  polyethylene glycol (MIRALAX / GLYCOLAX) packet Take 17 g by mouth daily.    [provider]  predniSONE (STERAPRED UNI-PAK) 10 MG tablet Prednisone 10 mg take  4 each am x 2 days,   2 each am x 2 days,  1 each am x2days and stop 03/14/13   Nyoka Cowden, MD  silver sulfADIAZINE (SILVADENE) 1 % cream Apply 1 application topically 2 (two) times daily.    [provider]  simvastatin (ZOCOR) 20 MG tablet Take 20 mg by mouth every evening.    [provider]  simvastatin (ZOCOR) 40 MG tablet Take 40 mg by mouth daily.    [provider]  sucralfate (CARAFATE) 1 GM/10ML suspension Take 1 g by mouth 4 (four) times daily.    [provider]  traMADol Janean Sark) 50  MG tablet Take 50 mg by mouth every 6 (six) hours as needed for pain.    [provider]  traMADol (ULTRAM) 50 MG tablet Take 2 tablets (100 mg total) by mouth every 6 (six) hours. 07/28/15   Nonie Hoyer, PA-C  traZODone (DESYREL) 50 MG tablet Take 50 mg by mouth 2 (two) times daily.    [provider]  valACYclovir (VALTREX) 1000 MG tablet Take 1,000 mg by mouth as  needed.    [provider]  valACYclovir (VALTREX) 1000 MG tablet Take 1,000 mg by mouth daily.    [provider]  varenicline (CHANTIX) 0.5 MG tablet Take 0.5 mg by mouth 2 (two) times daily.    [provider]  Vitamin D, Ergocalciferol, (DRISDOL) 50000 UNITS CAPS capsule Take 1 capsule (50,000 Units total) by mouth every 7 (seven) days. 07/28/15   Montez Morita, PA-C    Allergies Meperidine, Triamcinolone acetonide, Adhesive [tape], and Tape   REVIEW OF SYSTEMS  Negative except as noted here or in the History of Present Illness.   PHYSICAL EXAMINATION  Initial Vital Signs Blood pressure 104/62, pulse 77, temperature 97.9 F (36.6 C), resp. rate 18, SpO2 97 %.  Examination General: Well-developed, well-nourished female in no acute distress; appearance consistent with age of record HENT: normocephalic; atraumatic Eyes: Normal appearance Neck: supple Heart: regular rate and rhythm Lungs: clear to auscultation bilaterally Abdomen: soft; nondistended; nontender; bowel sounds present Extremities: No deformity; tenderness and swelling of left ankle; tenderness and swelling of right knee with pain on dorsiflexion of right foot or palpation of right calf; pulses normal Neurologic: Awake, alert and oriented; motor function intact in all extremities and symmetric; no facial droop Skin: Warm and dry Psychiatric: Normal mood and affect   RESULTS  Summary of this visit's results, reviewed and interpreted by myself:   EKG Interpretation  Date/Time:    Ventricular Rate:    PR Interval:    QRS Duration:   QT Interval:    QTC Calculation:   R Axis:     Text Interpretation:         Laboratory Studies: No results found for this or any previous visit (from the past 24 hour(s)). Imaging Studies: DG Ankle Complete Left  Result Date: 03/03/2023 CLINICAL DATA:  Fall several days ago, left ankle soreness. EXAM: LEFT ANKLE COMPLETE - 3+ VIEW COMPARISON:  None  Available. FINDINGS: There is no evidence of fracture or dislocation. Possible small joint effusion. The ankle mortise is preserved. Moderate-sized plantar calcaneal spur. No erosive change. Incidental soft tissue phleboliths in the lower leg. IMPRESSION: 1. No fracture or subluxation of the left ankle. 2. Possible small joint effusion. 3. Plantar calcaneal spur. Electronically Signed   By: Narda Rutherford M.D.   On: 03/03/2023 03:21   DG Knee Complete 4 Views Right  Result Date: 03/03/2023 CLINICAL DATA:  Knee pain. Fall several days ago. EXAM: RIGHT KNEE - COMPLETE 4+ VIEW COMPARISON:  None Available. FINDINGS: No acute fracture. Normal alignment without dislocation. Patellofemoral arthroplasty. Small knee joint effusion. There is mild tibiofemoral peripheral spurring and medial tibiofemoral joint space narrowing. Mild prepatellar soft tissue thickening. IMPRESSION: 1. No acute fracture or dislocation. 2. Patellofemoral arthroplasty with small joint effusion. 3. Mild tibiofemoral osteoarthritis. Electronically Signed   By: Narda Rutherford M.D.   On: 03/03/2023 03:20    ED COURSE and MDM  Nursing notes, initial and subsequent vitals signs, including pulse oximetry, reviewed and interpreted by myself.  Vitals:   03/03/23  0239 03/03/23 0241  BP: (!) 112/33 104/62  Pulse: 85 77  Resp: 18   Temp: 97.9 F (36.6 C)   SpO2: 97%    Medications  oxyCODONE (Oxy IR/ROXICODONE) immediate release tablet 20 mg (has no administration in time range)   We will place the patient in a left ASO and a right knee immobilizer for what appears to be an ankle sprain and likely internal knee injury, respectively.  She does not currently have an orthopedic surgeon so we will refer her should symptoms persist.  As noted above she is already on large dose chronic narcotics so we will not be prescribing any pain medication.  Because of her Xarelto treatment she is not permitted to take NSAIDs.  Although I suspect the  likelihood is low since she is on Xarelto and compliant, the patient is concerned about a new blood clot in the right leg.  We can arrange for her to have a Doppler later today.  She does not do well with crutches but does have a four-legged cane which she uses to assist with ambulation.   PROCEDURES  Procedures   ED DIAGNOSES     ICD-10-CM   1. Fall, initial encounter  W19.XXXA     2. Sprain of left ankle, initial encounter  S93.402A     3. Injury of right knee, initial encounter  S89.91XA          Eward Rutigliano, Jonny Ruiz, MD 03/03/23 [phone]

## 2023-03-03 NOTE — ED Triage Notes (Signed)
Patient fell 4 days ago, states that she sprained her left ankle, and right knee pain.  She states that tonight that her knee went out on her and now she has swelling to the right knee.  She is unable to walk due to pain in both areas.

## 2023-03-08 ENCOUNTER — Telehealth (HOSPITAL_BASED_OUTPATIENT_CLINIC_OR_DEPARTMENT_OTHER): Payer: Self-pay

## 2023-03-16 ENCOUNTER — Telehealth (HOSPITAL_BASED_OUTPATIENT_CLINIC_OR_DEPARTMENT_OTHER): Payer: Self-pay

## 2024-09-10 ENCOUNTER — Other Ambulatory Visit (HOSPITAL_BASED_OUTPATIENT_CLINIC_OR_DEPARTMENT_OTHER): Payer: Self-pay | Admitting: Neurosurgery

## 2024-09-10 DIAGNOSIS — G43909 Migraine, unspecified, not intractable, without status migrainosus: Secondary | ICD-10-CM

## 2024-09-10 DIAGNOSIS — M5440 Lumbago with sciatica, unspecified side: Secondary | ICD-10-CM

## 2024-09-14 ENCOUNTER — Ambulatory Visit (HOSPITAL_BASED_OUTPATIENT_CLINIC_OR_DEPARTMENT_OTHER): Admission: RE | Admit: 2024-09-14 | Discharge: 2024-09-14 | Attending: Neurosurgery | Admitting: Neurosurgery

## 2024-09-14 DIAGNOSIS — G43909 Migraine, unspecified, not intractable, without status migrainosus: Secondary | ICD-10-CM

## 2024-09-14 DIAGNOSIS — M5440 Lumbago with sciatica, unspecified side: Secondary | ICD-10-CM

## 2024-10-27 ENCOUNTER — Encounter (HOSPITAL_BASED_OUTPATIENT_CLINIC_OR_DEPARTMENT_OTHER): Payer: Self-pay

## 2024-10-27 ENCOUNTER — Other Ambulatory Visit: Payer: Self-pay

## 2024-10-27 ENCOUNTER — Emergency Department (HOSPITAL_BASED_OUTPATIENT_CLINIC_OR_DEPARTMENT_OTHER)

## 2024-10-27 ENCOUNTER — Emergency Department (HOSPITAL_BASED_OUTPATIENT_CLINIC_OR_DEPARTMENT_OTHER)
Admission: EM | Admit: 2024-10-27 | Discharge: 2024-10-27 | Disposition: A | Discharge status: Home / Self Care | Attending: Emergency Medicine | Admitting: Emergency Medicine

## 2024-10-27 DIAGNOSIS — M2550 Pain in unspecified joint: Secondary | ICD-10-CM

## 2024-10-27 DIAGNOSIS — M25511 Pain in right shoulder: Secondary | ICD-10-CM | POA: Diagnosis not present

## 2024-10-27 DIAGNOSIS — Y9301 Activity, walking, marching and hiking: Secondary | ICD-10-CM | POA: Insufficient documentation

## 2024-10-27 DIAGNOSIS — M25562 Pain in left knee: Secondary | ICD-10-CM | POA: Diagnosis not present

## 2024-10-27 DIAGNOSIS — M79661 Pain in right lower leg: Secondary | ICD-10-CM | POA: Insufficient documentation

## 2024-10-27 DIAGNOSIS — W000XXA Fall on same level due to ice and snow, initial encounter: Secondary | ICD-10-CM | POA: Diagnosis not present

## 2024-10-27 DIAGNOSIS — Z86718 Personal history of other venous thrombosis and embolism: Secondary | ICD-10-CM | POA: Insufficient documentation

## 2024-10-27 DIAGNOSIS — Z7901 Long term (current) use of anticoagulants: Secondary | ICD-10-CM | POA: Diagnosis not present

## 2024-10-27 DIAGNOSIS — M542 Cervicalgia: Secondary | ICD-10-CM | POA: Diagnosis not present

## 2024-10-27 DIAGNOSIS — Y92524 Gas station as the place of occurrence of the external cause: Secondary | ICD-10-CM | POA: Diagnosis not present

## 2024-10-27 DIAGNOSIS — M25512 Pain in left shoulder: Secondary | ICD-10-CM | POA: Insufficient documentation

## 2024-10-27 DIAGNOSIS — M25521 Pain in right elbow: Secondary | ICD-10-CM | POA: Diagnosis not present

## 2024-10-27 DIAGNOSIS — M25522 Pain in left elbow: Secondary | ICD-10-CM | POA: Diagnosis not present

## 2024-10-27 DIAGNOSIS — J449 Chronic obstructive pulmonary disease, unspecified: Secondary | ICD-10-CM | POA: Diagnosis not present

## 2024-10-27 DIAGNOSIS — R600 Localized edema: Secondary | ICD-10-CM | POA: Insufficient documentation

## 2024-10-27 DIAGNOSIS — M791 Myalgia, unspecified site: Secondary | ICD-10-CM

## 2024-10-27 DIAGNOSIS — S63502A Unspecified sprain of left wrist, initial encounter: Secondary | ICD-10-CM | POA: Insufficient documentation

## 2024-10-27 DIAGNOSIS — M25561 Pain in right knee: Secondary | ICD-10-CM | POA: Diagnosis not present

## 2024-10-27 DIAGNOSIS — W19XXXA Unspecified fall, initial encounter: Secondary | ICD-10-CM

## 2024-10-27 DIAGNOSIS — F0390 Unspecified dementia without behavioral disturbance: Secondary | ICD-10-CM | POA: Diagnosis not present

## 2024-10-27 DIAGNOSIS — M25532 Pain in left wrist: Secondary | ICD-10-CM | POA: Diagnosis present

## 2024-10-27 MED ORDER — MORPHINE SULFATE (PF) 4 MG/ML IV SOLN
4.0000 mg | Freq: Once | INTRAVENOUS | Status: AC
  Administered 2024-10-27: 4 mg via INTRAVENOUS
  Filled 2024-10-27: qty 1

## 2024-10-27 MED ORDER — LIDOCAINE 5 % EX PTCH: 1.0000 | MEDICATED_PATCH | CUTANEOUS | 0 refills | Status: AC

## 2024-10-27 MED ORDER — METHYLPREDNISOLONE 4 MG PO TBPK: ORAL_TABLET | ORAL | 0 refills | Status: AC
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
## 2024-10-27 NOTE — ED Notes (Signed)
 D/c paperwork reviewed with pt, including follow up care.  All questions and/or concerns addressed at time of d/c.  No further needs expressed. . Pt verbalized understanding, Ambulatory without assistance to ED exit, NAD.

## 2024-10-27 NOTE — ED Notes (Signed)
 Pt still at CT.

## 2024-10-27 NOTE — ED Provider Notes (Signed)
 " [facility] [facility] Provider Note   CSN: [id number] Arrival date & time: 10/27/24  1221     Patient presents with: Felton Car Stegenga-Amick is a 54 y.o. female.   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   Noting to have taken a fall while she was walking to her car at the gas station.  Currently on Xarelto 40 that she has not missed any doses.  Did not lose consciousness, and was able to ambulate after the incident.    Mainly complaining of bilateral shoulder pain, bilateral elbow pain, left hand/wrist pain, bilateral knee pain, right tibial/fibula pain, headache similar to previous migraines described as pulsatile with mild light sensitivity, cervical spine tenderness/pain, midline lumbar back pain additionally reports tingling over right 5th, 4th, 3rd and 2nd finger.  Decreased range of motion at left wrist, hand secondary to pain.  Extensive history of chronic pain and multiple orthopedic surgeries.  Denies saddle paresthesia, fecal/urinary incontinence, fever, dysuria, hematuria, IVD use.  Additionally denying blurry vision, tinnitus, dysphagia, chest pain, shortness of breath, abdominal pain, vomiting, diarrhea, seizures.   Prior to Admission medications  Medication Sig Start Date End Date Taking? Authorizing Provider  lidocaine  (LIDODERM ) 5 % Place 1 patch onto the skin daily. Remove & Discard patch within 12 hours or as directed by MD 10/27/24  Yes Beola, Jamarii Banks S, PA-C  methylPREDNISolone  (MEDROL   DOSEPAK) 4 MG TBPK tablet Take 6 tablets of 4 mg on day 1, 5 tablets on day 2, 4 tablets on day 3, 3 tablets on day 4, 2 tablets on day 5, 1 tablet on day 6. 10/27/24  Yes Channing Savich S, PA-C  acetaminophen  (TYLENOL ) 325 MG tablet Take 2 tablets (650 mg total) by mouth every 6 (six) hours as needed (Temperature 101.5 or greater). 07/28/15   Baird, Megan N, PA-C  albuterol  (PROAIR  HFA) 108 (90 BASE) MCG/ACT inhaler Inhale 2 puffs into the lungs every 6 (six) hours as needed for wheezing.    [provider]  albuterol  (PROVENTIL  HFA;VENTOLIN  HFA) 108 (90 BASE) MCG/ACT inhaler Inhale 1-2 puffs into the lungs every 6 (six) hours as needed for wheezing or shortness of breath.    [provider]  ALPRAZolam  (XANAX ) 1 MG tablet Take 1 mg by mouth 4 (four) times daily.    [provider]  ALPRAZolam  (XANAX ) 1 MG tablet Take 2 mg by mouth 2 (two) times daily as needed for anxiety.    [provider]  ammonium lactate (AMLACTIN) 12 % cream Apply 1 g topically 2 (two) times daily.    [provider]  amphetamine -dextroamphetamine  (ADDERALL XR) 10 MG 24 hr capsule Take 10 mg by mouth 4 (four) times daily.    [provider]  amphetamine -dextroamphetamine  (ADDERALL) 10 MG tablet Take 5-10 mg by mouth 4 (four) times daily.    [provider]  bacitracin  ointment Apply topically 2 (two) times daily. 07/28/15   Baird, Megan N, PA-C  benzonatate (TESSALON) 100 MG capsule Take 200 mg by mouth 2 (two) times daily as needed for cough.    [provider]  benzonatate (TESSALON) 200 MG capsule Take 400 mg by mouth 2 (two) times daily.    [provider]  butalbital -acetaminophen -caffeine  (FIORICET  WITH CODEINE) 50-325-40-30 MG per capsule Take 1 capsule by mouth every 4 (four) hours as needed for headache.    [provider]  butalbital -acetaminophen -caffeine  (FIORICET , ESGIC ) 50-325-40 MG tablet Take 1 tablet by mouth every 6 (six)  hours as needed (pain).    [provider]  citalopram  (CELEXA ) 20 MG tablet Take 20 mg by mouth daily.    [provider]  citalopram  (CELEXA ) 20 MG tablet Take 20 mg by mouth daily.    [provider]  clobetasol cream (TEMOVATE) 0.05 % Apply 1 application topically as needed.    [provider]  clobetasol cream (TEMOVATE) 0.05 % Apply 1 application topically 2 (two) times daily.    [provider]  cyclobenzaprine  (FLEXERIL ) 10 MG tablet Take 10 mg by mouth 3 (three) times daily as needed for muscle spasms.    [provider]  cyclobenzaprine  (FLEXERIL ) 10 MG tablet Take 10 mg by mouth 3 (three) times daily as needed for muscle spasms.    [provider]  diclofenac sodium (VOLTAREN) 1 % GEL Apply 4 g topically 3 (three) times daily.    [provider]  docusate sodium  (COLACE) 100 MG capsule Take 1 capsule (100 mg total) by mouth 2 (two) times daily as needed for mild constipation. 07/28/15   Baird, Megan N, PA-C  donepezil  (ARICEPT ) 10 MG tablet Take 10 mg by mouth 2 (two) times daily.    [provider]  donepezil  (ARICEPT ) 10 MG tablet Take 10 mg by mouth 2 (two) times daily.    [provider]  ergocalciferol  (VITAMIN D2) 50000 UNITS capsule Take 50,000 Units by mouth once a week.    [provider]  esomeprazole (NEXIUM) 40 MG capsule Take 40 mg by mouth 2 (two) times daily.    [provider]  esomeprazole (NEXIUM) 40 MG capsule Take 40 mg by mouth 2 (two) times daily before a meal.    [provider]  famotidine (PEPCID) 40 MG tablet Take 40 mg by mouth daily.    [provider]  famotidine (PEPCID) 40 MG tablet Take 40 mg by mouth at bedtime.    [provider]  fluconazole (DIFLUCAN) 150 MG tablet Take 150 mg by mouth once. Per month    [provider]  gabapentin  (NEURONTIN ) 600 MG tablet Take 600 mg by mouth 4 (four) times daily.    [provider]  gabapentin  (NEURONTIN ) 600 MG tablet Take 600 mg by mouth 4 (four) times daily.    [provider]  ibuprofen (ADVIL,MOTRIN) 800 MG tablet Take 800 mg by mouth every 8 (eight) hours as needed (arthritis pain).    [provider]  imiquimod (ALDARA) 5 % cream Apply 1 application topically 3 (three) times a week.    [provider]  levETIRAcetam  (KEPPRA ) 750 MG tablet Take 1,500-2,250 mg by mouth 2 (two) times daily. 1500mg  in the morning, 2250mg  in the evening    [provider]  lidocaine  (LIDODERM ) 5 % Place 1 patch onto the skin as needed. Remove & Discard patch within 12 hours or as directed by MD    [provider]  magnesium  oxide (MAG-OX) 400 MG tablet Take 400 mg by mouth daily.    [provider]  meclizine  (ANTIVERT ) 25 MG tablet Take 25 mg by mouth 3 (three)  times daily as needed for dizziness or nausea.    [provider]  meclizine  (ANTIVERT ) 25 MG tablet Take 25 mg by mouth 3 (three) times daily as needed for dizziness.    [provider]  metoCLOPramide  (REGLAN ) 5 MG tablet Take 5 mg by mouth daily.    [provider]  metroNIDAZOLE (FLAGYL) 500 MG tablet Take 500 mg by mouth daily. 3-4 times per wk    [provider]  metroNIDAZOLE (METROGEL) 0.75 % gel Apply 1 application topically 2 (two) times daily.    [provider]  montelukast  (SINGULAIR ) 10 MG tablet Take 10 mg by mouth at bedtime.    [provider]  ondansetron  (ZOFRAN ) 4 MG tablet Take 4 mg by mouth every 6 (six) hours.    [provider]  oxyCODONE  (OXYCONTIN ) 20 MG 12 hr tablet Take 20 mg by mouth 4 (four) times daily.    [provider]  OxyCODONE  (OXYCONTIN ) 80 mg T12A 12 hr tablet Take 1 tablet (80 mg total) by mouth every 12 (twelve) hours. 07/28/15   Baird, Megan N, PA-C  Oxycodone  HCl 20 MG TABS Take 0.5-1 tablets (10-20 mg total) by mouth every 4 (four) hours as needed.  07/31/15   Baird, Megan N, PA-C  polyethylene glycol (MIRALAX  / GLYCOLAX ) packet Take 17 g by mouth daily.    [provider]  predniSONE  (STERAPRED UNI-PAK) 10 MG tablet Prednisone  10 mg take  4 each am x 2 days,   2 each am x 2 days,  1 each am x2days and stop 03/14/13   Wert, Michael B, MD  silver sulfADIAZINE (SILVADENE) 1 % cream Apply 1 application topically 2 (two) times daily.    [provider]  simvastatin  (ZOCOR ) 20 MG tablet Take 20 mg by mouth every evening.    [provider]  simvastatin  (ZOCOR ) 40 MG tablet Take 40 mg by mouth daily.    [provider]  sucralfate (CARAFATE) 1 GM/10ML suspension Take 1 g by mouth 4 (four) times daily.    [provider]  traMADol  (ULTRAM ) 50 MG tablet Take 50 mg by mouth every 6 (six) hours as needed for pain.    [provider]  traMADol  (ULTRAM ) 50 MG tablet Take 2 tablets (100 mg total) by mouth every 6 (six) hours. 07/28/15   Elwin Duwaine SAILOR, PA-C  traZODone  (DESYREL ) 50 MG tablet Take 50 mg by mouth 2 (two) times daily.    [provider]  valACYclovir  (VALTREX ) 1000 MG tablet Take 1,000 mg by mouth as needed.    [provider]  valACYclovir  (VALTREX ) 1000 MG tablet Take 1,000 mg by mouth daily.    [provider]  varenicline (CHANTIX) 0.5 MG tablet Take 0.5 mg by mouth 2 (two) times daily.    [provider]  Vitamin D , Ergocalciferol , (DRISDOL ) 50000 UNITS CAPS capsule Take 1 capsule (50,000 Units total) by mouth every 7 (seven) days. 07/28/15   Deward Eck, PA-C    Allergies: Meperidine, Triamcinolone acetonide, Adhesive [tape], and Tape    Review of Systems  Musculoskeletal:  Positive for arthralgias, back pain, joint swelling and myalgias.  All other systems reviewed and are negative.   Updated Vital Signs BP 135/74   Pulse 81   Temp 97.7 F (36.5 C) (Oral)   Resp 18   SpO2 99%   Physical Exam Vitals and nursing note reviewed.   Constitutional:      General: She is not in acute distress.  Appearance: Normal appearance. She is not ill-appearing or diaphoretic.  HENT:     Head: Normocephalic and atraumatic.     Comments: Head without any obvious signs of deformity or injury, no bruising, hematoma, no hemotympanum.    Right Ear: Tympanic membrane, ear canal and external ear normal.     Left Ear: Tympanic membrane, ear canal and external ear normal.     Mouth/Throat:     Mouth: Mucous membranes are moist.     Pharynx: Oropharynx is clear. No oropharyngeal exudate or posterior oropharyngeal erythema.  Eyes:     General: No scleral icterus.       Right eye: No discharge.        Left eye: No discharge.     Extraocular Movements: Extraocular movements intact.     Conjunctiva/sclera: Conjunctivae normal.     Pupils: Pupils are equal, round, and reactive to light.  Neck:     Comments: Noted to have midline tenderness along C-spine, notably around C7. Cardiovascular:     Rate and Rhythm: Normal rate and regular rhythm.     Pulses: Normal pulses.     Heart sounds: Normal heart sounds. No murmur heard.    No friction rub. No gallop.  Pulmonary:     Effort: Pulmonary effort is normal. No respiratory distress.     Breath sounds: No stridor. No wheezing, rhonchi or rales.  Chest:     Chest wall: No tenderness.  Abdominal:     General: Abdomen is flat. There is no distension.     Palpations: Abdomen is soft.     Tenderness: There is no abdominal tenderness. There is no right CVA tenderness, left CVA tenderness, guarding or rebound.  Musculoskeletal:        General: Swelling and tenderness present. No deformity.     Cervical back: Normal range of motion. Tenderness present. No rigidity.     Right lower leg: Edema present.     Comments: Notably has tenderness to palpation over dorsum of left wrist, with decreased range of motion at wrist secondary to pain.  Radial pulse 2+, normal sensation, cap refill is  normal.  Additionally noting pain to bilateral elbows, worse on left than right.  Additionally noting pain to palpation over bilateral shoulders over [facility] joint.  Additionally noting pain over bilateral knees particularly over the lateral portions.  Additionally noting pain over right tibia/fibula, crying out when palpating.  No obvious deformities.    Skin:    General: Skin is warm and dry.     Findings: No bruising, erythema or lesion.  Neurological:     General: No focal deficit present.     Mental Status: She is alert and oriented to person, place, and time. Mental status is at baseline.     Sensory: No sensory deficit.     Motor: No weakness.     Comments: Noted to have normal grip strength bilaterally, normal sensation to both upper and lower extremities.  Psychiatric:        Mood and Affect: Mood normal.     (all labs ordered are listed, but only abnormal results are displayed) Labs Reviewed - No data to display  EKG: None  Radiology: DG Knee Complete 4 Views Left Result Date: 10/27/2024 CLINICAL DATA:  Bilateral knee pain status post fall. EXAM: LEFT KNEE - COMPLETE 4+ VIEW; RIGHT KNEE - COMPLETE 4+ VIEW COMPARISON:  Right knee radiographs 03/03/2023. FINDINGS: The bones appear mildly demineralized. There are postsurgical changes from previous patellofemoral arthroplasty  bilaterally. The hardware appears intact, without loosening. There is no evidence of acute fracture or dislocation in either knee. Medial greater than lateral tibiofemoral degenerative changes, progressive on the right. No significant joint effusion or unexpected foreign bodies. IMPRESSION: 1. No evidence of acute fracture or dislocation in either knee. 2. Previous patellofemoral arthroplasty bilaterally. 3. Tibiofemoral degenerative changes, progressive on the right. Electronically Signed   By: Elsie Perone M.D.   On: 10/27/2024 15:11   DG Knee Complete 4 Views Right Result Date: 10/27/2024 CLINICAL  DATA:  Bilateral knee pain status post fall. EXAM: LEFT KNEE - COMPLETE 4+ VIEW; RIGHT KNEE - COMPLETE 4+ VIEW COMPARISON:  Right knee radiographs 03/03/2023. FINDINGS: The bones appear mildly demineralized. There are postsurgical changes from previous patellofemoral arthroplasty bilaterally. The hardware appears intact, without loosening. There is no evidence of acute fracture or dislocation in either knee. Medial greater than lateral tibiofemoral degenerative changes, progressive on the right. No significant joint effusion or unexpected foreign bodies. IMPRESSION: 1. No evidence of acute fracture or dislocation in either knee. 2. Previous patellofemoral arthroplasty bilaterally. 3. Tibiofemoral degenerative changes, progressive on the right. Electronically Signed   By: Elsie Perone M.D.   On: 10/27/2024 15:11   DG Shoulder Right Result Date: 10/27/2024 CLINICAL DATA:  Status post fall on ice.  Bilateral shoulder pain. EXAM: RIGHT SHOULDER - 2+ VIEW; LEFT SHOULDER - 2+ VIEW COMPARISON:  CT of the left humerus 07/11/2016. FINDINGS: The mineralization and alignment are normal. There is no evidence of acute fracture or dislocation at either shoulder. There are postsurgical changes on the left status post left humeral plate and screw fixation. The hardware is intact. Suspected mild posttraumatic deformity of the right humeral neck with progressive glenohumeral degenerative changes and intra-articular loose bodies. No unexpected foreign body or soft tissue emphysema at either shoulder. IMPRESSION: 1. No evidence of acute fracture or dislocation at either shoulder. 2. Postsurgical changes on the left with intact hardware. 3. Progressive right glenohumeral degenerative changes with intra-articular loose bodies. Electronically Signed   By: Elsie Perone M.D.   On: 10/27/2024 15:08   DG Shoulder Left Result Date: 10/27/2024 CLINICAL DATA:  Status post fall on ice.  Bilateral shoulder pain. EXAM: RIGHT SHOULDER -  2+ VIEW; LEFT SHOULDER - 2+ VIEW COMPARISON:  CT of the left humerus 07/11/2016. FINDINGS: The mineralization and alignment are normal. There is no evidence of acute fracture or dislocation at either shoulder. There are postsurgical changes on the left status post left humeral plate and screw fixation. The hardware is intact. Suspected mild posttraumatic deformity of the right humeral neck with progressive glenohumeral degenerative changes and intra-articular loose bodies. No unexpected foreign body or soft tissue emphysema at either shoulder. IMPRESSION: 1. No evidence of acute fracture or dislocation at either shoulder. 2. Postsurgical changes on the left with intact hardware. 3. Progressive right glenohumeral degenerative changes with intra-articular loose bodies. Electronically Signed   By: Elsie Perone M.D.   On: 10/27/2024 15:08   DG Elbow Complete Right Result Date: 10/27/2024 CLINICAL DATA:  Status post fall on ice. Elbow pain and limited range of motion. EXAM: RIGHT ELBOW - COMPLETE 3+ VIEW COMPARISON:  None Available. FINDINGS: The mineralization and alignment are normal. There is no evidence of acute fracture or dislocation. No evidence of elbow joint effusion or foreign body. Mild spurring of the coronoid process. IMPRESSION: No evidence of acute fracture or dislocation. Mild spurring of the coronoid process. Electronically Signed   By: Elsie Perone HERO.D.  On: 10/27/2024 15:06   DG Hand Complete Right Result Date: 10/27/2024 CLINICAL DATA:  Bilateral hand pain after falling on ice yesterday morning. EXAM: RIGHT HAND - COMPLETE 3+ VIEW; LEFT HAND - COMPLETE 3+ VIEW COMPARISON:  None Available. FINDINGS: Patient was unable to remove the rings from either hand due to swelling. The mineralization and alignment are normal. There is no evidence of acute fracture or dislocation. Mild degenerative changes in both hands, primarily involving the 1st carpometacarpal articulations bilaterally and the  distal interphalangeal joints of the right 2nd and 3rd digits. No unexpected foreign bodies or soft tissue emphysema. IMPRESSION: No evidence of acute fracture or dislocation in either hand. Mild degenerative changes as described. Electronically Signed   By: Elsie Perone M.D.   On: 10/27/2024 15:05   DG Hand Complete Left Result Date: 10/27/2024 CLINICAL DATA:  Bilateral hand pain after falling on ice yesterday morning. EXAM: RIGHT HAND - COMPLETE 3+ VIEW; LEFT HAND - COMPLETE 3+ VIEW COMPARISON:  None Available. FINDINGS: Patient was unable to remove the rings from either hand due to swelling. The mineralization and alignment are normal. There is no evidence of acute fracture or dislocation. Mild degenerative changes in both hands, primarily involving the 1st carpometacarpal articulations bilaterally and the distal interphalangeal joints of the right 2nd and 3rd digits. No unexpected foreign bodies or soft tissue emphysema. IMPRESSION: No evidence of acute fracture or dislocation in either hand. Mild degenerative changes as described. Electronically Signed   By: Elsie Perone M.D.   On: 10/27/2024 15:05   CT Head Wo Contrast Result Date: 10/27/2024 EXAM: CT HEAD WITHOUT CONTRAST 10/27/2024 02:26:55 PM TECHNIQUE: CT of the head was performed without the administration of intravenous contrast. Automated exposure control, iterative reconstruction, and/or weight based adjustment of the mA/kV was utilized to reduce the radiation dose to as low as reasonably achievable. COMPARISON: MRI head 09/14/2024. CLINICAL HISTORY: Head trauma, coagulopathy. Age 28-64 years. FINDINGS: BRAIN AND VENTRICLES: No acute hemorrhage. No evidence of acute infarct. No hydrocephalus. No extra-axial collection. No mass effect or midline shift. ORBITS: No acute abnormality. SINUSES: No acute abnormality. SOFT TISSUES AND SKULL: Trace left mastoid effusion. No acute soft tissue abnormality. No skull fracture. IMPRESSION: 1. No acute  intracranial abnormality. Electronically signed by: Donnice Mania MD 10/27/2024 03:03 PM EST RP Workstation: [id number]   DG Tibia/Fibula Right Result Date: 10/27/2024 CLINICAL DATA:  Status post fall on ice.  Lower leg pain. EXAM: RIGHT TIBIA AND FIBULA - 2 VIEW COMPARISON:  Right knee radiographs 03/03/2023. FINDINGS: Stable postsurgical changes from previous patellofemoral arthroplasty. The hardware appears intact. The bones are demineralized. There are progressive medial compartment degenerative changes at the knee. No evidence of acute fracture or dislocation. No significant knee joint effusion. Scattered soft tissue calcifications are noted. IMPRESSION: No evidence of acute fracture or dislocation. Progressive medial compartment degenerative changes at the knee. Electronically Signed   By: Elsie Perone M.D.   On: 10/27/2024 15:02   DG Elbow Complete Left Result Date: 10/27/2024 CLINICAL DATA:  Status post fall on ice. Elbow pain with limited range of motion. EXAM: LEFT ELBOW - COMPLETE 3+ VIEW COMPARISON:  CT of the left humerus 07/11/2016. FINDINGS: Status post previous left humeral plate and screw fixation, incompletely visualized. The mineralization and alignment are normal. There is no evidence of acute fracture or dislocation. No evidence of elbow joint effusion. Mild spurring of the coronoid process. IMPRESSION: No evidence of acute fracture or dislocation. Previous left humeral plate and screw fixation. Electronically  Signed   By: Elsie Perone M.D.   On: 10/27/2024 15:01   CT Thoracic Spine Wo Contrast Result Date: 10/27/2024 CLINICAL DATA:  Acute back pain and stiffness after falling yesterday morning. EXAM: CT THORACIC AND LUMBAR SPINE WITHOUT CONTRAST TECHNIQUE: Multidetector CT imaging of the thoracic and lumbar spine was performed without contrast. Multiplanar CT image reconstructions were also generated. RADIATION DOSE REDUCTION: This exam was performed according to the departmental  dose-optimization program which includes automated exposure control, adjustment of the mA and/or kV according to patient size and/or use of iterative reconstruction technique. COMPARISON:  Lumbar MRI 09/14/2024 FINDINGS: CT THORACIC SPINE FINDINGS Alignment: Normal. Vertebrae: No evidence of acute thoracic spine fracture or traumatic subluxation. Stable deformity of the T1 spinous process with sclerotic margins, unchanged from remote cervical MRI. Paraspinal and other soft tissues: No acute paraspinal findings. Mild dependent atelectasis in both lungs. Disc levels: Mild multilevel spondylosis with disc bulging and endplate osteophytes. No evidence of large disc herniation, high-grade spinal stenosis or high-grade foraminal narrowing CT LUMBAR SPINE FINDINGS Segmentation: Transitional lumbosacral anatomy. As counted from the occiput put, the transitional segment is L5, partially sacralized bilaterally. This numbering differs from the previous lumbar MRI. Alignment: Normal. Vertebrae: No evidence of acute fracture or traumatic subluxation. No aggressive osseous lesions. Bilateral lumbosacral assimilation joints. Paraspinal and other soft tissues: No significant paraspinal findings. Subcapsular lipoma along the posterior aspect of the liver appears stable from previous chest CT 07/11/2016. Mild aortic and branch vessel atherosclerosis. Disc levels: Grossly stable left-sided disc extrusion with caudal migration of disc material at L1-2 (note renumbering as discussed above). No acute disc space findings are demonstrated. No significant foraminal narrowing. Mild multilevel facet arthropathy. IMPRESSION: 1. No evidence of acute fracture or traumatic subluxation of the thoracic or lumbar spine. 2. Mild multilevel spondylosis without evidence of high-grade spinal stenosis or high-grade foraminal narrowing. 3. 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 As counted from the skull base, the  transitional segment is L5 (differing from the numbering applied to lumbar MRI 09/14/2024). 4. Grossly stable left-sided disc extrusion with caudal migration of disc material at L1-2 (note renumbering as discussed above). 5.  Aortic Atherosclerosis (ICD10-I70.0). Electronically Signed   By: Elsie Perone M.D.   On: 10/27/2024 14:55   CT Lumbar Spine Wo Contrast Result Date: 10/27/2024 CLINICAL DATA:  Acute back pain and stiffness after falling yesterday morning. EXAM: CT THORACIC AND LUMBAR SPINE WITHOUT CONTRAST TECHNIQUE: Multidetector CT imaging of the thoracic and lumbar spine was performed without contrast. Multiplanar CT image reconstructions were also generated. RADIATION DOSE REDUCTION: This exam was performed according to the departmental dose-optimization program which includes automated exposure control, adjustment of the mA and/or kV according to patient size and/or use of iterative reconstruction technique. COMPARISON:  Lumbar MRI 09/14/2024 FINDINGS: CT THORACIC SPINE FINDINGS Alignment: Normal. Vertebrae: No evidence of acute thoracic spine fracture or traumatic subluxation. Stable deformity of the T1 spinous process with sclerotic margins, unchanged from remote cervical MRI. Paraspinal and other soft tissues: No acute paraspinal findings. Mild dependent atelectasis in both lungs. Disc levels: Mild multilevel spondylosis with disc bulging and endplate osteophytes. No evidence of large disc herniation, high-grade spinal stenosis or high-grade foraminal narrowing CT LUMBAR SPINE FINDINGS Segmentation: Transitional lumbosacral anatomy. As counted from the occiput put, the transitional segment is L5, partially sacralized bilaterally. This numbering differs from the previous lumbar MRI. Alignment: Normal. Vertebrae: No evidence of acute fracture or traumatic subluxation. No aggressive osseous lesions. Bilateral lumbosacral assimilation joints.  Paraspinal and other soft tissues: No significant  paraspinal findings. Subcapsular lipoma along the posterior aspect of the liver appears stable from previous chest CT 07/11/2016. Mild aortic and branch vessel atherosclerosis. Disc levels: Grossly stable left-sided disc extrusion with caudal migration of disc material at L1-2 (note renumbering as discussed above). No acute disc space findings are demonstrated. No significant foraminal narrowing. Mild multilevel facet arthropathy. IMPRESSION: 1. No evidence of acute fracture or traumatic subluxation of the thoracic or lumbar spine. 2. Mild multilevel spondylosis without evidence of high-grade spinal stenosis or high-grade foraminal narrowing. 3. 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 As counted from the skull base, the transitional segment is L5 (differing from the numbering applied to lumbar MRI 09/14/2024). 4. Grossly stable left-sided disc extrusion with caudal migration of disc material at L1-2 (note renumbering as discussed above). 5.  Aortic Atherosclerosis (ICD10-I70.0). Electronically Signed   By: Elsie Perone M.D.   On: 10/27/2024 14:55   CT Cervical Spine Wo Contrast Result Date: 10/27/2024 CLINICAL DATA:  Neck trauma, focal neuro deficit or paresthesia (Age 68-64y) Acute neck pain and stiffness after falling yesterday morning. EXAM: CT CERVICAL SPINE WITHOUT CONTRAST TECHNIQUE: Multidetector CT imaging of the cervical spine was performed without intravenous contrast. Multiplanar CT image reconstructions were also generated. RADIATION DOSE REDUCTION: This exam was performed according to the departmental dose-optimization program which includes automated exposure control, adjustment of the mA and/or kV according to patient size and/or use of iterative reconstruction technique. COMPARISON:  MRI of the cervical spine 09/06/2017. FINDINGS: Alignment: Straightening without focal angulation. There is a minimal anterolisthesis at C3-4. Skull base and vertebrae: No  evidence of acute fracture or traumatic subluxation. Chronic deformity of the T1 spinous process is unchanged from previous MRI. Soft tissues and spinal canal: No prevertebral fluid or swelling. No visible canal hematoma. Disc levels: Mild multilevel spondylosis, similar to previous MRI. Disc space narrowing and uncinate spurring are greatest at C4-5 and C5-6. There is asymmetric left-sided facet hypertrophy at C3-4. Mild spinal stenosis and mild to moderate foraminal narrowing at C5-6. No large disc herniation identified. Upper chest: Clear lung apices. Other: None. IMPRESSION: 1. No evidence of acute cervical spine fracture, traumatic subluxation or static signs of instability. 2. Mild multilevel cervical spondylosis, similar to previous MRI. Electronically Signed   By: Elsie Perone M.D.   On: 10/27/2024 14:42   Procedures   Medications Ordered in the ED  morphine  (PF) 4 MG/ML injection 4 mg (4 mg Intravenous Given 10/27/24 1431)      Medical Decision Making  This patient is a 54 year old female  who presents to the ED for concern of mechanical fall on Xarelto approximately 2 days ago but has not been with a ride to the ED secondary to weather conditions.  Noting to had headache similar to her previous migraines while additionally noting to be taking her pain medication at home without relief.  Reporting worsening tingling sensation to right hand as well as arthralgias to bilateral knees, bilateral elbows, left wrist, bilateral shoulders.  Additionally noting pain over right sided tibia-fibula.  On physical exam, patient is in no acute distress, afebrile, alert and orient x 4, speaking in full sentences, nontachypneic, nontachycardic.  Notably tearful on examination secondary to pain.  Normal pushing bilaterally.  Good cap refill and neurovascularly intact.  Notably having tenderness to palpation without any signs of bruising to both upper extremities, lower extremities and head.  No signs of  obvious skull fracture.  Notably tender over  midline C-spine and lumbar spine as well as reporting tingling sensations to right arm.  Placed in c-collar.  Patient is concerned for multiple fractures requesting x-rays at this time of all areas of arthralgia.  With patient's history, suspecting possible hairline fractures however low suspicion for any major deformity without any obvious deformity noted on examination and without signs of bruising.\  X-rays and CT scans were ordered.  Pain control was also ordered.  CTs and x-rays were negative for any acute abnormality.  Does show degenerative arthritis.  Suspecting likely acute on chronic pain.  However continue to manage with outpatient management.  Following up with orthopedic surgery and pain management.  Patient notably unable to take NSAIDs secondary to being on blood thinning medication.  Additionally recently came off steroid 2 weeks ago according to patient.  Will prescribe a taper today for acute on chronic pain.  Additionally will provide lidocaine  patches and have her follow-up with hand surgery and pain management.  Providing brace.  Patient vital signs have remained stable throughout the course of patient's time in the ED. Low suspicion for any other emergent pathology at this time. I believe this patient is safe to be discharged. Provided strict return to ER precautions. Patient expressed agreement and understanding of plan. All questions were answered.  Differential diagnoses prior to evaluation: The emergent differential diagnosis includes, but is not limited to, fracture, ligamentous injury, neurovascular injury, dislocation, malalignment, CVA, cauda equina. This is not an exhaustive differential.   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 Pertinent results include:   Last seen by PCP on 10/16/2024, medications refilled at time.  Noted to be followed by pain clinic at that time.  Noted be wishing to placed on GLP-1 medication, placed on tirzepatide.  Lab Tests/Imaging studies: I personally interpreted labs/imaging and the pertinent results include:   CT head without any signs of acute abnormality CT cervical spine without any signs of acute abnormality. CT lumbar and thoracic spine shows grossly stable left-sided disc protrusion with caudal migration of disc material at L1-L2.  With no acute abnormalities noted.  Noted to have mild multilevel spondylosis. X-ray of left elbow unremarkable without any acute abnormalities X-ray of right elbow shows arthritis without any acute abnormalities X-ray of left hand shows mild degenerative changes with no acute abnormality X-ray of right hand shows mild degenerative changes any acute abnormality X-ray of right shoulder shows postsurgical changes progressive arthritis and loose intra-articular bodies without any acute abnormalities X-ray left shoulder shows no acute fracture or dislocation, does shows postsurgical changes with intact hardware   X-rays of bilateral knees were unremarkable for any acute abnormalities. I agree with the radiologist interpretation.  Medications: I ordered medication including morphine .  I have reviewed the patients home medicines and have made adjustments as needed.  Critical Interventions: None  Social Determinants of Health: Has good follow-up with orthopedic surgery  Disposition: After consideration of the diagnostic results and the patients response to treatment, I feel that the patient would benefit from discharge and treatment as above.   =emergency department workup does not suggest an emergent condition requiring admission  or immediate intervention beyond what has been performed at this time. The plan is: Follow-up orthopedic surgery, chronic pain management at home. The patient is  safe for discharge and has been instructed to return immediately for worsening symptoms, change in symptoms or any other concerns.   Final diagnoses:  Fall, initial encounter  Myalgia  Arthralgia, unspecified joint  Sprain of left wrist, unspecified location, initial encounter    ED Discharge Orders          Ordered    methylPREDNISolone  (MEDROL  DOSEPAK) 4 MG TBPK tablet        10/27/24 1545    lidocaine  (LIDODERM ) 5 %  Every 24 hours        10/27/24 1545               Beola Terrall RAMAN, [place] 10/27/24 1546  "

## 2024-10-27 NOTE — ED Notes (Signed)
 Pt not presently in room, at CT.

## 2024-10-27 NOTE — Discharge Instructions (Addendum)
 You were seen today for injuries after a fall.  Your left wrist is likely sprained, recommendation to follow-up with hand surgery which I provided his information here for further evaluation in the upcoming weeks.  Additionally provided primary care here at [facility] to get established.  I sent in a steroid taper for you to take for pain.  Additionally you can use lidocaine  patches which I have also sent in.  Continue to follow-up with your chronic pain medication for any new or worsening pain and adjusting your narcotic medications.  Return to the ER for new or worsening symptoms

## 2024-10-27 NOTE — ED Triage Notes (Signed)
 Slipped on ice yesterday. Reports BIL hands, shoulder, elbows, knees pain since.  Also hit back of head, migraine, dizziness  On xarelto
